# Patient Record
Sex: Male | Born: 1955
Health system: Southern US, Community
[De-identification: ages and names within clinical notes are randomized; demographics above are authoritative.]

## PROBLEM LIST (undated history)

## (undated) DIAGNOSIS — I10 Essential (primary) hypertension: Secondary | ICD-10-CM

## (undated) DIAGNOSIS — M1990 Unspecified osteoarthritis, unspecified site: Secondary | ICD-10-CM

## (undated) DIAGNOSIS — M549 Dorsalgia, unspecified: Secondary | ICD-10-CM

## (undated) DIAGNOSIS — F419 Anxiety disorder, unspecified: Secondary | ICD-10-CM

## (undated) DIAGNOSIS — E079 Disorder of thyroid, unspecified: Secondary | ICD-10-CM

## (undated) DIAGNOSIS — G8929 Other chronic pain: Secondary | ICD-10-CM

## (undated) DIAGNOSIS — E785 Hyperlipidemia, unspecified: Secondary | ICD-10-CM

## (undated) HISTORY — DX: Essential (primary) hypertension: I10

## (undated) HISTORY — PX: ANTERIOR FUSION CERVICAL SPINE: SUR626

## (undated) HISTORY — DX: Disorder of thyroid, unspecified: E07.9

## (undated) HISTORY — DX: Dorsalgia, unspecified: M54.9

## (undated) HISTORY — DX: Other chronic pain: G89.29

## (undated) HISTORY — DX: Unspecified osteoarthritis, unspecified site: M19.90

## (undated) HISTORY — DX: Hyperlipidemia, unspecified: E78.5

## (undated) HISTORY — DX: Anxiety disorder, unspecified: F41.9

---

## 2015-08-07 DIAGNOSIS — M549 Dorsalgia, unspecified: Secondary | ICD-10-CM

## 2015-08-07 DIAGNOSIS — G8929 Other chronic pain: Secondary | ICD-10-CM

## 2015-08-07 HISTORY — DX: Dorsalgia, unspecified: M54.9

## 2015-08-07 HISTORY — DX: Other chronic pain: G89.29

## 2017-07-10 ENCOUNTER — Emergency Department (HOSPITAL_BASED_OUTPATIENT_CLINIC_OR_DEPARTMENT_OTHER)
Admit: 2017-07-10 | Discharge: 2017-07-10 | Disposition: A | Discharge status: Home / Self Care | Payer: Medicare Other | Attending: Emergency Medicine | Admitting: Emergency Medicine

## 2017-07-10 ENCOUNTER — Encounter (HOSPITAL_COMMUNITY): Payer: Self-pay | Admitting: Emergency Medicine

## 2017-07-10 ENCOUNTER — Emergency Department (HOSPITAL_COMMUNITY)
Admission: EM | Admit: 2017-07-10 | Discharge: 2017-07-10 | Disposition: A | Discharge status: Home / Self Care | Payer: Medicare Other | Attending: Emergency Medicine | Admitting: Emergency Medicine

## 2017-07-10 ENCOUNTER — Emergency Department (HOSPITAL_COMMUNITY): Payer: Medicare Other

## 2017-07-10 DIAGNOSIS — X500XXA Overexertion from strenuous movement or load, initial encounter: Secondary | ICD-10-CM | POA: Diagnosis not present

## 2017-07-10 DIAGNOSIS — S59911A Unspecified injury of right forearm, initial encounter: Secondary | ICD-10-CM | POA: Insufficient documentation

## 2017-07-10 DIAGNOSIS — M7989 Other specified soft tissue disorders: Secondary | ICD-10-CM

## 2017-07-10 DIAGNOSIS — Y929 Unspecified place or not applicable: Secondary | ICD-10-CM | POA: Insufficient documentation

## 2017-07-10 DIAGNOSIS — Y998 Other external cause status: Secondary | ICD-10-CM | POA: Insufficient documentation

## 2017-07-10 DIAGNOSIS — Y9389 Activity, other specified: Secondary | ICD-10-CM | POA: Diagnosis not present

## 2017-07-10 DIAGNOSIS — M79601 Pain in right arm: Secondary | ICD-10-CM

## 2017-07-10 MED ORDER — NAPROXEN 250 MG PO TABS
500.0000 mg | ORAL_TABLET | Freq: Once | ORAL | Status: AC
  Administered 2017-07-10: 500 mg via ORAL
  Filled 2017-07-10: qty 2

## 2017-07-10 MED ORDER — HYDROCODONE-ACETAMINOPHEN 5-325 MG PO TABS
1.0000 | ORAL_TABLET | Freq: Once | ORAL | Status: AC
  Administered 2017-07-10: 1 via ORAL
  Filled 2017-07-10: qty 1

## 2017-07-10 MED ORDER — CYCLOBENZAPRINE HCL 10 MG PO TABS: 10.0000 mg | ORAL_TABLET | Freq: Three times a day (TID) | ORAL | 0 refills | Status: DC | PRN

## 2017-07-10 MED ORDER — NAPROXEN 250 MG PO TABS: 250.0000 mg | ORAL_TABLET | Freq: Two times a day (BID) | ORAL | 0 refills | Status: DC

## 2017-07-10 NOTE — ED Notes (Signed)
Patient transported to X-ray 

## 2017-07-10 NOTE — ED Provider Notes (Signed)
[facility]-EMERGENCY DEPT Provider Note   CSN: [id number] Arrival date & time: 07/10/17  [id number]  By signing my name below, I, Linna DarnerRussell Turner, attest that this documentation has been prepared under the direction and in the presence of Demetrios LollKenneth Leaphart, PA-C. Electronically Signed: Linna Darnerussell Turner, Scribe. 07/10/2017. 10:27 AM.  History   Chief Complaint Chief Complaint  Patient presents with  . Arm Pain   The history is provided by the patient. No language interpreter was used.    HPI Comments: Ricky Walter is a 61 y.o. male who presents to the Emergency Department for evaluation of a volar right forearm injury sustained yesterday evening. He was trying to open the hood of his car that was stuck on the latch. He pulled upward with his right arm and heard popping sensations from the right forearm followed by significant pain. Patient states his pain has gradually worsened and he has also developed some swelling to his right forearm. He reports that he is unable to flex his right arm secondary to pain. There are no alleviating factors noted. No prior h/o right forearm injuries. He denies numbness/tingling, bruising, open wounds, or any other associated symptoms.  History reviewed. No pertinent past medical history.  There are no active problems to display for this patient.   History reviewed. No pertinent surgical history.     Home Medications    Prior to Admission medications   Not on File    Family History History reviewed. No pertinent family history.  Social History Social History  Substance Use Topics  . Smoking status: Never Smoker  . Smokeless tobacco: Never Used  . Alcohol use No     Allergies   Patient has no known allergies.   Review of Systems Review of Systems  Musculoskeletal: Positive for joint swelling and myalgias.  Skin: Negative for color change and wound.  Neurological: Negative for weakness and numbness.   Physical Exam Updated Vital Signs BP (!) 131/99  (BP Location: Left Arm)   Pulse 68   Temp 98.7 F (37.1 C) (Oral)   Resp 17   Ht 5\' 11"  (1.803 m)   Wt 228 lb (103.4 kg)   SpO2 97%   BMI 31.80 kg/m   Physical Exam  Constitutional: He is oriented to person, place, and time. He appears well-developed and well-nourished. No distress.  HENT:  Head: Normocephalic and atraumatic.  Eyes: Conjunctivae and EOM are normal.  Neck: Neck supple. No tracheal deviation present.  Cardiovascular: Normal rate and intact distal pulses.   Pulmonary/Chest: Effort normal. No respiratory distress.  Musculoskeletal: Normal range of motion.  Patient with pain to the right forearm with flexion and extension. Has to passively flex his right elbow with his left hand. Edema noted of the right forearm. No obvious edema of the right elbow. Full range of motion of the right shoulder. Patient able to fully raise his arm over his shoulder. Patient able to fully flex and extend all MC, DIP, PIP joints of the right hand. Sensation intact. Cap refill normal. Radial pulses 2+ bilaterally. No obvious bulging. No erythema or warmth to the joints.  Neurological: He is alert and oriented to person, place, and time.  Normal grip strength bilaterally.  Skin: Skin is warm and dry. Capillary refill takes less than 2 seconds.  Psychiatric: He has a normal mood and affect. His behavior is normal.  Nursing note and vitals reviewed.  ED Treatments / Results  Labs (all labs ordered are listed, but only abnormal results are displayed)  Labs Reviewed - No data to display  EKG  EKG Interpretation None       Radiology Dg Elbow Complete Right  Result Date: 07/10/2017 CLINICAL DATA:  Injured right elbow/forearm yesterday evening. EXAM: RIGHT ELBOW - COMPLETE 3+ VIEW COMPARISON:  None. FINDINGS: The joint spaces are maintained. Minimal degenerative changes. Small olecranon spur. No fracture or osteochondral lesion. No joint effusion. IMPRESSION: Minimal/ mild degenerative changes  but no acute bony findings or joint effusion. Electronically Signed   By: Rudie Meyer M.D.   On: 07/10/2017 11:46    Procedures Procedures (including critical care time)  DIAGNOSTIC STUDIES: Oxygen Saturation is 97% on RA, normal by my interpretation.    COORDINATION OF CARE: 10:26 AM Discussed treatment plan with pt at bedside and pt agreed to plan.  Medications Ordered in ED Medications  HYDROcodone-acetaminophen (NORCO/VICODIN) 5-325 MG per tablet 1 tablet (1 tablet Oral Given 07/10/17 1053)  naproxen (NAPROSYN) tablet 500 mg (500 mg Oral Given 07/10/17 1406)     Initial Impression / Assessment and Plan / ED Course  I have reviewed the triage vital signs and the nursing notes.  Pertinent labs & imaging results that were available during my care of the patient were reviewed by me and considered in my medical decision making (see chart for details).     Patient presents to the ED with right forearm pain following mechanical injury yesterday. Edema noted to the right forearm. Patient is neurovascularly intact with normal strength. No signs of septic joints. Patient X-Ray negative for obvious fracture or dislocation. Ultrasound and no signs of DVT. Unsure patient etiology likely due to muscle strain or pull. Pain managed in ED. Pt advised to follow up with orthopedics if symptoms persist for possibility of missed fracture diagnosis. Patient given sling while in ED, conservative therapy recommended and discussed. Patient will be dc home & is agreeable with above plan.   Final Clinical Impressions(s) / ED Diagnoses   Final diagnoses:  Right arm pain    New Prescriptions Discharge Medication List as of 07/10/2017  2:13 PM    START taking these medications   Details  cyclobenzaprine (FLEXERIL) 10 MG tablet Take 1 tablet (10 mg total) by mouth 3 (three) times daily as needed for muscle spasms., Starting Thu 07/10/2017, Print    naproxen (NAPROSYN) 250 MG tablet Take 1 tablet (250 mg  total) by mouth 2 (two) times daily., Starting Thu 07/10/2017, Print       I personally performed the services described in this documentation, which was scribed in my presence. The recorded information has been reviewed and is accurate.    Rise Mu, PA-C 07/10/17 1429    Cathren Laine, MD 07/10/17 [phone]

## 2017-07-10 NOTE — ED Triage Notes (Signed)
Pt sts right arm pain around elbow after injuring while working on a car yesterday

## 2017-07-10 NOTE — Discharge Instructions (Signed)
X-ray showed no signs of fracture or dislocation. Her ultrasound shows no signs of blood clot. Unsure of the etiology of your pain. Likely from pulled muscle or sprain. Wear the sling for comfort. Increase range of motion as tolerated. Rest, ice, elevate the arm. Take the naproxen as prescribed. Avoid any extra ibuprofen, Aleve, Advil, Motrin. Please the the Flexeril for muscle relaxation. This medication will make you drowsy so avoid situation that could place you in danger.   Follow-up with the orthopedic doctor if symptoms are not improving. Return to the ED if your symptoms worsen.

## 2017-07-10 NOTE — Progress Notes (Signed)
**  Preliminary report by tech**  Right upper extremity venous duplex complete. There is no evidence of deep or superficial vein thrombosis involving the right upper extremity. All visualized vessels appear patent and compressible.  Results were given to Dr. Denton LankSteinl.   07/10/17 1:59 PM Ricky Walter RVT

## 2017-08-06 ENCOUNTER — Encounter: Payer: Self-pay | Admitting: Family Medicine

## 2017-08-06 ENCOUNTER — Ambulatory Visit (INDEPENDENT_AMBULATORY_CARE_PROVIDER_SITE_OTHER): Payer: Medicare Other | Admitting: Family Medicine

## 2017-08-06 VITALS — BP 132/88 | HR 70 | Temp 98.9°F | Ht 71.0 in | Wt 229.0 lb

## 2017-08-06 DIAGNOSIS — N529 Male erectile dysfunction, unspecified: Secondary | ICD-10-CM | POA: Diagnosis not present

## 2017-08-06 DIAGNOSIS — M19042 Primary osteoarthritis, left hand: Secondary | ICD-10-CM

## 2017-08-06 DIAGNOSIS — M19041 Primary osteoarthritis, right hand: Secondary | ICD-10-CM | POA: Diagnosis not present

## 2017-08-06 DIAGNOSIS — M545 Low back pain, unspecified: Secondary | ICD-10-CM | POA: Insufficient documentation

## 2017-08-06 DIAGNOSIS — F329 Major depressive disorder, single episode, unspecified: Secondary | ICD-10-CM

## 2017-08-06 DIAGNOSIS — Z0000 Encounter for general adult medical examination without abnormal findings: Secondary | ICD-10-CM | POA: Insufficient documentation

## 2017-08-06 DIAGNOSIS — E039 Hypothyroidism, unspecified: Secondary | ICD-10-CM | POA: Insufficient documentation

## 2017-08-06 DIAGNOSIS — F32A Depression, unspecified: Secondary | ICD-10-CM | POA: Insufficient documentation

## 2017-08-06 DIAGNOSIS — M549 Dorsalgia, unspecified: Secondary | ICD-10-CM

## 2017-08-06 DIAGNOSIS — M1990 Unspecified osteoarthritis, unspecified site: Secondary | ICD-10-CM

## 2017-08-06 DIAGNOSIS — G8929 Other chronic pain: Secondary | ICD-10-CM | POA: Insufficient documentation

## 2017-08-06 DIAGNOSIS — M5441 Lumbago with sciatica, right side: Secondary | ICD-10-CM

## 2017-08-06 MED ORDER — TADALAFIL 20 MG PO TABS: 10.0000 mg | ORAL_TABLET | ORAL | 1 refills | Status: DC | PRN

## 2017-08-06 MED ORDER — NAPROXEN 250 MG PO TABS: 250.0000 mg | ORAL_TABLET | Freq: Two times a day (BID) | ORAL | 0 refills | Status: AC

## 2017-08-06 MED ORDER — NAPROXEN 250 MG PO TABS: 250.0000 mg | ORAL_TABLET | Freq: Two times a day (BID) | ORAL | 0 refills | Status: DC

## 2017-08-06 NOTE — Progress Notes (Signed)
Subjective: Chief Complaint  Patient presents with  . New Patient (Initial Visit)     HPI: Ricky Walter is a 61 y.o. presenting to clinic today to discuss the following:  1 Chronic Back Pain 2 Depressed mood 3 Arthritis in the hands and feet 4 Erectile Dysfunction 5 Hypothyroidism  Patient is new to practice and states he has not had any health care for the past 2 years. Around that time he suddenly had left-sided weakness, decreased ability to walk and could no longer work. Since that time he has had chronic back pain. He states he has had multiple workups for a stroke but all came back negative. He cannot recall any inciting event that could have caused the issue. His back pain has been constant since this time but he has not taken any medication for it in over a year.   His inability to work has caused him to feel down and depressed with being very emotional at times and easily upset and made to cry. He sometimes cries without knowing the reason why. He denies any suicidal ideation or feeling hopeless, or feeling a loss of interest in activities that he likes to do.  He also endorses pain in his hands bilaterally and swelling for the past two years that gets worse with use and better with rest.  He endorses he no longer is able to get and maintain an erection despite wanting to engage in sexual intercourse with his wife.  He stats he has previously been diagnosed with Hypothyroidism but has not taken any medication in over a year due to not having any health insurance.  Health Maintenance: Hep C screen     ROS noted in HPI.   Past Medical, Surgical, Social, and Family History Reviewed & Updated per EMR.   Pertinent Historical Findings include:   History  Smoking Status  . Never Smoker  Smokeless Tobacco  . Never Used      Objective: BP 132/88   Pulse 70   Temp 98.9 F (37.2 C) (Oral)   Ht 5' 11"  (1.803 m)   Wt 229 lb (103.9 kg)   SpO2 98%   BMI 31.94 kg/m    Vitals and nursing notes reviewed  Physical Exam  Gen: Alert and Oriented x 3, NAD HEENT: Normocephalic, atraumatic, PERRLA, EOMI, TM visible with good light reflex, non-swollen, non-erythematous turbinates, normal pharyngeal mucosa Neck: trachea midline, no thyroidmegaly, no LAD, scar on right side of neck. Resp: CTAB, no wheezing, rales, or rhonchi, comfortable work of breathing CV: RRR, no murmurs, normal S1, S2 split, +2 pulses dorsalis pedis bilaterally Abd: non-distended, non-tender, soft, +bs in all four quadrants, no hepatosplenomegaly MSK: Limited flexion, extension, and abduction of LUE, otherwise FROM in all four extremities Ext: no clubbing, cyanosis, or edema Neuro: CN II-XII intact, no focal or gross deficits; 5/5 strength on RUE and RLE, 4/5 strength on LUE and LLE. Reflexes intact Psych: appropriate behavior, mood, denies any suicidal ideation   No results found for this or any previous visit (from the past 92 hour(s)).  Assessment/Plan:  Hypothyroidism Patient states he has been diagnosed with Hypothyroidism but has not taken any medication for it in over a year. States he was on Levothyroxine. Getting TSH to check current levels.  Will prescribe Levothyroxine if TSH is elevated.  Arthritis Patient has had chronic arthritis for the past two years primarily in his hands. He has not been taking any pain medication for it in over a year.  Patient states Tylenol upsets his stomach and he cannot take it.   Prescribed Naproxen 254m BID to see if it helps control his pain. Ordered RF, ESR, and CRP to rule out Rheumatoid Arthritis as a potential cause of patient symptoms.  Erectile dysfunction Patient has had symptoms for past two years.  Gave a 5 day supply of Cialis to see if it helps with symptoms. Patient will return in one month to follow up.  Ordered labs to rule out potential secondary causes.  Mood disorder of depressed type Patient scored 2 on PHQ-9 and  denies suicidal ideation. He is having frequent episodes of crying, intense emotional swings, and feeling down.  Although patient is not clinically depressed he is exhibiting signs of potential mood disorder and could benefit from behavioral health involvement. Losing his job 2 years ago has significantly changes is attitude according to patient and his wife.  Encounter for medical examination to establish care Ordered CBC, CMP+Liver, Lipids, Hep C screen to get baseline labs and check for underlying health issues.  Patient will get BP cuff and record 2 weeks worth of readings to make sure he is not hypertensive. Prehypertensive today in office.  Chronic back pain greater than 3 months duration Started patient on Naproxen 2528mBID for chronic back pain. Patient is not a good candidate for opiods due to chronic issues and would likely benefit from physical therapy.   Advised patient to attend physical therapy at next visit to help with symptoms.    Please see problem based Assessment and Plan PATIENT EDUCATION PROVIDED: See AVS    Diagnosis and plan along with any newly prescribed medication(s) were discussed in detail with this patient today. The patient verbalized understanding and agreed with the plan. Patient advised if symptoms worsen return to clinic or ER.   Health Maintainance:   Orders Placed This Encounter  Procedures  . CBC with Differential  . CMP and Liver  . TSH  . Lipid Panel  . Rheumatoid factor  . C-reactive protein  . Sedimentation Rate  . Hepatitis C Antibody    Meds ordered this encounter  Medications  . DISCONTD: naproxen (NAPROSYN) 250 MG tablet    Sig: Take 1 tablet (250 mg total) by mouth 2 (two) times daily.    Dispense:  20 tablet    Refill:  0  . naproxen (NAPROSYN) 250 MG tablet    Sig: Take 1 tablet (250 mg total) by mouth 2 (two) times daily.    Dispense:  60 tablet    Refill:  0  . tadalafil (CIALIS) 20 MG tablet    Sig: Take 0.5 tablets  (10 mg total) by mouth every other day as needed for erectile dysfunction.    Dispense:  5 tablet    Refill:  1 ToyahDO 08/06/2017, 2:46 PM PGY-1, [place]

## 2017-08-06 NOTE — Assessment & Plan Note (Signed)
Ordered CBC, CMP+Liver, Lipids, Hep C screen to get baseline labs and check for underlying health issues.  Patient will get BP cuff and record 2 weeks worth of readings to make sure he is not hypertensive. Prehypertensive today in office.

## 2017-08-06 NOTE — Patient Instructions (Addendum)
It was great to meet you today! Thank you for letting me participate in your care!  Today, we discussed your chronic back pain. We will start you on Naproxen 250mg  twice a day for better pain control.  We also discussed your joint pain and swelling that is likely due to osteoarthritis. The Naproxen will help control this pain as well but it will not make the pain go away. Rest and ice packs will also help with the pain in your joints.  We also discussed your current episodes of crying, feeling down, and feeling tired. Please call Dr. Pascal LuxKane and schedule an appointment to meet with her to talk about these issues as I feel like she can provide good counseling.  Finally, we also addressed your erectile dysfunction. I will start you on Cialis today.  Be well, Ricky Schickim Dashaun Onstott, DO PGY-1, [facility]

## 2017-08-06 NOTE — Assessment & Plan Note (Signed)
Patient states he has been diagnosed with Hypothyroidism but has not taken any medication for it in over a year. States he was on Levothyroxine. Getting TSH to check current levels.  Will prescribe Levothyroxine if TSH is elevated.

## 2017-08-06 NOTE — Assessment & Plan Note (Signed)
Patient scored 2 on PHQ-9 and denies suicidal ideation. He is having frequent episodes of crying, intense emotional swings, and feeling down.  Although patient is not clinically depressed he is exhibiting signs of potential mood disorder and could benefit from behavioral health involvement. Losing his job 2 years ago has significantly changes is attitude according to patient and his wife.

## 2017-08-06 NOTE — Assessment & Plan Note (Signed)
Patient has had symptoms for past two years.  Gave a 5 day supply of Cialis to see if it helps with symptoms. Patient will return in one month to follow up.  Ordered labs to rule out potential secondary causes.

## 2017-08-06 NOTE — Assessment & Plan Note (Signed)
Patient has had chronic arthritis for the past two years primarily in his hands. He has not been taking any pain medication for it in over a year.   Patient states Tylenol upsets his stomach and he cannot take it.   Prescribed Naproxen 280m BID to see if it helps control his pain. Ordered RF, ESR, and CRP to rule out Rheumatoid Arthritis as a potential cause of patient symptoms.

## 2017-08-06 NOTE — Assessment & Plan Note (Signed)
Started patient on Naproxen 250mg  BID for chronic back pain. Patient is not a good candidate for opiods due to chronic issues and would likely benefit from physical therapy.   Advised patient to attend physical therapy at next visit to help with symptoms.

## 2017-08-07 ENCOUNTER — Other Ambulatory Visit: Payer: Self-pay | Admitting: Family Medicine

## 2017-08-07 DIAGNOSIS — E039 Hypothyroidism, unspecified: Secondary | ICD-10-CM

## 2017-08-07 DIAGNOSIS — R768 Other specified abnormal immunological findings in serum: Secondary | ICD-10-CM

## 2017-08-07 LAB — CBC WITH DIFFERENTIAL/PLATELET
BASOS ABS: 0 10*3/uL (ref 0.0–0.2)
BASOS: 0 %
EOS (ABSOLUTE): 0.1 10*3/uL (ref 0.0–0.4)
Eos: 2 %
Hematocrit: 43.4 % (ref 37.5–51.0)
Hemoglobin: 14.1 g/dL (ref 13.0–17.7)
IMMATURE GRANS (ABS): 0 10*3/uL (ref 0.0–0.1)
IMMATURE GRANULOCYTES: 0 %
LYMPHS: 26 %
Lymphocytes Absolute: 1.5 10*3/uL (ref 0.7–3.1)
MCH: 29.4 pg (ref 26.6–33.0)
MCHC: 32.5 g/dL (ref 31.5–35.7)
MCV: 90 fL (ref 79–97)
MONOS ABS: 0.4 10*3/uL (ref 0.1–0.9)
Monocytes: 7 %
NEUTROS PCT: 65 %
Neutrophils Absolute: 3.7 10*3/uL (ref 1.4–7.0)
PLATELETS: 214 10*3/uL (ref 150–379)
RBC: 4.8 x10E6/uL (ref 4.14–5.80)
RDW: 14.4 % (ref 12.3–15.4)
WBC: 5.8 10*3/uL (ref 3.4–10.8)

## 2017-08-07 LAB — CMP AND LIVER
ALBUMIN: 4.4 g/dL (ref 3.6–4.8)
ALK PHOS: 95 IU/L (ref 39–117)
ALT: 31 IU/L (ref 0–44)
AST: 40 IU/L (ref 0–40)
BUN: 11 mg/dL (ref 8–27)
Bilirubin Total: 0.4 mg/dL (ref 0.0–1.2)
Bilirubin, Direct: 0.12 mg/dL (ref 0.00–0.40)
CO2: 25 mmol/L (ref 20–29)
CREATININE: 1.13 mg/dL (ref 0.76–1.27)
Calcium: 9.6 mg/dL (ref 8.6–10.2)
Chloride: 102 mmol/L (ref 96–106)
GFR calc Af Amer: 81 mL/min/{1.73_m2} (ref >59)
GFR calc non Af Amer: 70 mL/min/{1.73_m2} (ref >59)
GLUCOSE: 96 mg/dL (ref 65–99)
POTASSIUM: 4.7 mmol/L (ref 3.5–5.2)
Sodium: 143 mmol/L (ref 134–144)
TOTAL PROTEIN: 7.7 g/dL (ref 6.0–8.5)

## 2017-08-07 LAB — TSH: TSH: 11.33 u[IU]/mL — AB (ref 0.450–4.500)

## 2017-08-07 LAB — LIPID PANEL
CHOLESTEROL TOTAL: 168 mg/dL (ref 100–199)
Chol/HDL Ratio: 3.1 ratio (ref 0.0–5.0)
HDL: 54 mg/dL (ref >39)
LDL Calculated: 95 mg/dL (ref 0–99)
Triglycerides: 96 mg/dL (ref 0–149)
VLDL CHOLESTEROL CAL: 19 mg/dL (ref 5–40)

## 2017-08-07 LAB — C-REACTIVE PROTEIN: CRP: 0.5 mg/L (ref 0.0–4.9)

## 2017-08-07 LAB — HEPATITIS C ANTIBODY

## 2017-08-07 LAB — RHEUMATOID FACTOR

## 2017-08-07 LAB — SEDIMENTATION RATE: SED RATE: 18 mm/h (ref 0–30)

## 2017-08-07 MED ORDER — LEVOTHYROXINE SODIUM 150 MCG PO TABS: 150.0000 ug | ORAL_TABLET | Freq: Every day | ORAL | 3 refills | Status: DC

## 2017-08-07 NOTE — Progress Notes (Signed)
Patient was called and told he had elevated TSH and that I sent in Levothyroxine starting at dose to take once per day before breakfast with water.  He was also told he tested positive for Hepatitis C Antibody and would need another test. He can come in and just get blood work.  Informed patient I will see him pending results of blood work and at least in 6 weeks time for thyroid check.

## 2017-08-07 NOTE — Assessment & Plan Note (Signed)
TSH was elevated at 11.330  Patient will be restarted on Levothyroxine starting at 1.846mcg/kg/d which comes out to 166mcg per day. I will start him on 150mcg daily and then see him again in 6 weeks to get another TSH.

## 2017-08-11 ENCOUNTER — Other Ambulatory Visit: Payer: Self-pay | Admitting: Family Medicine

## 2017-08-11 ENCOUNTER — Telehealth: Payer: Self-pay | Admitting: *Deleted

## 2017-08-11 MED ORDER — SILDENAFIL CITRATE 100 MG PO TABS: 50.0000 mg | ORAL_TABLET | Freq: Every day | ORAL | 1 refills | Status: DC | PRN

## 2017-08-11 NOTE — Telephone Encounter (Signed)
Pharmacist from [facility] requesting to change tadalafil 20 mg to sildenafil l 20 mg. Sildenafil is cheaper for the patient. Please advise. Clovis Pu, RN

## 2017-08-11 NOTE — Progress Notes (Signed)
Pharmacy called for patient requesting change from Cialis to Viagra due to cost. Made the change.

## 2017-08-14 ENCOUNTER — Telehealth: Payer: Self-pay | Admitting: Family Medicine

## 2017-08-14 NOTE — Telephone Encounter (Signed)
His pharmacy does not carry cialis so he would like viagra or generic viagra.  His pharmacy is [facility].

## 2017-08-15 NOTE — Telephone Encounter (Signed)
Patient informed. 

## 2017-08-20 ENCOUNTER — Telehealth: Payer: Self-pay | Admitting: Psychology

## 2017-08-20 NOTE — Telephone Encounter (Signed)
Patient called to request a [facility] appointment.  He had a PHQ-9 of 2 at his last visit with Dr. Karen Chafe but has been feeling down secondary to a job loss.  He wished to come on Thursday at 10:00.  Scheduled him with Sammuel Hines.

## 2017-08-21 ENCOUNTER — Telehealth: Payer: Self-pay | Admitting: *Deleted

## 2017-08-21 NOTE — Telephone Encounter (Signed)
Patient called requesting lab results. Patient remembered speaking with provider regarding Hep C and TSH levels. Advised patient that all other labs were normal. Advised patient that he should start the Hep A and B vaccines due to positive Hep C results. Explained to patient what the vaccines protect against.  Appointment scheduled with nurse on 08/28/17 for first Hept B and Flu shot. Hep A vaccine will be given at Hep B #2 schedule dose.    Patient is requesting that PCP give him a call to discuss when his next follow up would be for Hep C diagnosis.  Please advise. Number on file is correct.  Clovis PuMartin, Tamika L, RN

## 2017-08-26 ENCOUNTER — Ambulatory Visit: Payer: Medicare Other

## 2017-08-28 ENCOUNTER — Ambulatory Visit (INDEPENDENT_AMBULATORY_CARE_PROVIDER_SITE_OTHER): Payer: Medicare Other | Admitting: Licensed Clinical Social Worker

## 2017-08-28 ENCOUNTER — Ambulatory Visit (INDEPENDENT_AMBULATORY_CARE_PROVIDER_SITE_OTHER): Payer: Medicare HMO | Admitting: *Deleted

## 2017-08-28 DIAGNOSIS — F439 Reaction to severe stress, unspecified: Secondary | ICD-10-CM

## 2017-08-28 DIAGNOSIS — Z23 Encounter for immunization: Secondary | ICD-10-CM

## 2017-08-28 NOTE — Progress Notes (Signed)
  Total time:30 minutes Type of Service: Integrated Behavioral Health office visit Interpretor:No.  SUBJECTIVE: Ricky Walter is a 61 y.o. male  referred by Dr. Pascal LuxKane for feeling down secondary to job loss.  Patient reports the following symptoms and or concerns: loss of interest in favorite activities, feeling down and feeling like something is missing from his life.  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  GOALS ADDRESSED:  Patient will reduce symptoms of: depression and stress; increase ability [id number]:coping skills, self-management skills and stress reduction, he will also :Increase healthy adjustment to current life circumstances. ISSUES DISCUSSED: family stressors,relationship stressors and psychosocial stressors  INTERVENTIONS:Brief Counseling/Psychotherapy , Reflective listening. Standardized Assessments completed: PHQ 9=6,indicates symptoms of: mild depression.  ASSESSMENT: Patient currently experiencing symptoms of mild depression.  Symptoms exacerbated by family stressors and life transitions.  Patient may benefit from, and is in agreement to receive further assessment and brief therapeutic interventions to assist with managing his symptoms.   PLAN: 1. Patient will F/U with LCSW in one week 2. Behavioral recommendations: none at this time  3. Referral:none at this time  Ricky Hineseborah Paxten Appelt, LCSW Licensed Clinical Social  [facility]   [phone] 11:41 AM

## 2017-08-28 NOTE — Progress Notes (Signed)
Patient present for flu and Hep B vaccines.  Patient is also wondering when he needs to get his follow up Hep C labs.  Will forward to MD to advise on this.  Patient is returning next week for PPD placement and to see integrated care.  He made a follow up appointment with PCP on 09-18-17. Arraya Buck,CMA

## 2017-09-03 ENCOUNTER — Ambulatory Visit: Payer: Medicare Other

## 2017-09-12 ENCOUNTER — Telehealth: Payer: Self-pay | Admitting: Family Medicine

## 2017-09-18 ENCOUNTER — Ambulatory Visit: Payer: Medicare Other | Admitting: Family Medicine

## 2017-09-25 ENCOUNTER — Ambulatory Visit (INDEPENDENT_AMBULATORY_CARE_PROVIDER_SITE_OTHER): Payer: Medicare HMO | Admitting: Family Medicine

## 2017-09-25 ENCOUNTER — Encounter: Payer: Self-pay | Admitting: Family Medicine

## 2017-09-25 VITALS — BP 122/80 | HR 71 | Temp 98.2°F | Ht 71.0 in | Wt 222.0 lb

## 2017-09-25 DIAGNOSIS — N529 Male erectile dysfunction, unspecified: Secondary | ICD-10-CM

## 2017-09-25 DIAGNOSIS — R768 Other specified abnormal immunological findings in serum: Secondary | ICD-10-CM | POA: Diagnosis not present

## 2017-09-25 DIAGNOSIS — M5441 Lumbago with sciatica, right side: Secondary | ICD-10-CM

## 2017-09-25 DIAGNOSIS — B182 Chronic viral hepatitis C: Secondary | ICD-10-CM | POA: Insufficient documentation

## 2017-09-25 DIAGNOSIS — E039 Hypothyroidism, unspecified: Secondary | ICD-10-CM | POA: Diagnosis not present

## 2017-09-25 DIAGNOSIS — G8929 Other chronic pain: Secondary | ICD-10-CM

## 2017-09-25 MED ORDER — SILDENAFIL CITRATE 20 MG PO TABS: ORAL_TABLET | ORAL | 0 refills | Status: DC

## 2017-09-25 MED ORDER — DULOXETINE HCL 30 MG PO CPEP: 60.0000 mg | ORAL_CAPSULE | Freq: Every day | ORAL | 2 refills | Status: DC

## 2017-09-25 NOTE — Progress Notes (Signed)
Subjective:   Patient ID: Ricky Walter    DOB: [birth year], 61 y.o. male   MRN: [id number]  CC: "Back pain"  HPI: Ricky Walter is a 61 y.o. male who presents to clinic today for back pain. Problems discussed today are as follows:  Chronic lower back pain: Patient states he was without health care for approximately 2 years until he developed sudden left-sided weakness and decreased ability to walk. He had extensive stroke workup, all of which were negative. Patient was subsequently found to have nerve impingement due to cervical spinal stenosis requiring surgery back on 10/2016. Patient states he did not comply with physical therapy during this time and has followed up as of 12/2016. He continues to have chronic right-sided low back pain since the surgery. He states he tried the naproxen with some improvement. He denies any recent trauma. ROS: Denies fevers or chills, nausea or vomiting, dysuria, melena or hematochezia, saddle anesthesia, loss of motor function or sensory loss, bowel or bladder incontinence.  Hypothyroidism: Patient was told he had low thyroid after his TSH was tested during his last visit. He was started on 150 g of Synthroid. He states he has tolerated this medication well. Prior to the test, he states he was off his medications for over one year. ROS: Denies change in weight, heat or cold intolerance, diarrhea or constipation, palpitations, diaphoresis, shortness of breath, chest pain.  Positive hepatitis C antibody: Recently screened for HCV found to have positive antibody. He denies history of IV drug use or exposure to HCV.  Erectile dysfunction: Ongoing issue for approximately 2 years. He states he cannot achieve or maintain an erection. He has been unable to fill his Viagra or Cialis prescription due to cost.  Complete ROS performed, see HPI for pertinent.  PMFSH: Hypothyroidism, arthritis, ED. Surgical history anterior fusion of cervical spine. Family history HLD, HTN,  thyroid disease, stroke. Smoking status reviewed. Medications reviewed.  Objective:   BP 122/80   Pulse 71   Temp 98.2 F (36.8 C) (Oral)   Ht  (1.803 m)   Wt 222 lb (100.7 kg)   SpO2 97%   BMI 30.96 kg/m  Vitals and nursing note reviewed.  General: elderly male, well nourished, well developed, in no acute distress with non-toxic appearance HEENT: normocephalic, atraumatic, moist mucous membranes Neck: supple, non-tender without lymphadenopathy, anterior neck scar on right side from prior surgery CV: regular rate and rhythm without murmurs, rubs, or gallops, no lower extremity edema Lungs: clear to auscultation bilaterally with normal work of breathing Abdomen: soft, non-tender, non-distended, normoactive bowel sounds Skin: warm, dry, no rashes or lesions, cap refill < 2 seconds Extremities: warm and well perfused, normal tone, ambulating slowly with cane primarily due to right back tenderness at paraspinal muscle on lumbar region, sensation intact throughout  Assessment & Plan:   Chronic right-sided low back pain with right-sided sciatica Chronic. Problem since cervical spine surgery last year. Likely multifactorial including deconditioned state, OA and poor ergonomics due to surgery, failure to f/u with PT. --Given trial of Cymbalta with instructions to take 30 mg daily for 1 week, then increase to 60 mg daily --RTC 4 weeks for recheck  Hypothyroidism Chronic. Has been off meds for >1 year. Restarted on synthroid 1 month ago. Will need to check TSH today to determine titration. --Will obtain TSH --Continue Synthroid 150 mcg daily  Erectile dysfunction Chronic. No nocturnal tumescence. Has not tried ED meds in past due to cost. Not taking nitrates. --  Will give trail of sildenafil 100 mg PRN  HCV antibody positive New finding on screening. No history of high risk exposure. --Will check quant level today and refer to infectious disease if positive for  treatment  Orders Placed This Encounter  Procedures  . TSH   Meds ordered this encounter  Medications  . sildenafil (REVATIO) 20 MG tablet    Sig: Take 5 tablets (100 mg) at once 1 hour prior to sexual activity as needed. Do not exceed 100 mg daily.    Dispense:  30 tablet    Refill:  0  . DULoxetine (CYMBALTA) 30 MG capsule    Sig: Take 2 capsules (60 mg total) by mouth daily.    Dispense:  60 capsule    Refill:  2    Durward Parcel, DO [facility], PGY-2 09/26/2017 10:36 AM

## 2017-09-25 NOTE — Assessment & Plan Note (Addendum)
Chronic. Problem since cervical spine surgery last year. Likely multifactorial including deconditioned state, OA and poor ergonomics due to surgery, failure to f/u with PT. --Given trial of Cymbalta with instructions to take 30 mg daily for 1 week, then increase to 60 mg daily --RTC 4 weeks for recheck

## 2017-09-25 NOTE — Assessment & Plan Note (Addendum)
Chronic. No nocturnal tumescence. Has not tried ED meds in past due to cost. Not taking nitrates. --Will give trail of sildenafil 100 mg PRN

## 2017-09-25 NOTE — Patient Instructions (Addendum)
Thank you for coming in to see [place] today. Please see below to review our plan for today's visit.  1. We will check your thyroid level today. Based on the results, I will contact you regarding what to do with your Synthroid. 2. We will check your hepatitis C titer level to figure out if you truly have hepatitis C. This is a treatable virus. 3. Take the sildenafil 20 mg tablets as instructed. Will take a total of 5 tablets at a time one hour prior to sexual intercourse. He will have to pay out of pocket for this medication. This is our most affordable erectile dysfunction medication. 4. I have started you on a new medication for your back pain called Cymbalta. Please take 1 capsule daily for the first week, then increase to 2 capsules daily.  Please call the clinic at [phone] if your symptoms worsen or you have any concerns. It was my pleasure to see you. -- Durward Parcel, DO [facility], PGY-2

## 2017-09-25 NOTE — Assessment & Plan Note (Addendum)
New finding on screening. No history of high risk exposure. --Will check quant level today and refer to infectious disease if positive for treatment

## 2017-09-25 NOTE — Assessment & Plan Note (Addendum)
Chronic. Has been off meds for >1 year. Restarted on synthroid 1 month ago. Will need to check TSH today to determine titration. --Will obtain TSH --Continue Synthroid 150 mcg daily

## 2017-09-26 ENCOUNTER — Telehealth: Payer: Self-pay | Admitting: Family Medicine

## 2017-09-26 LAB — TSH: TSH: 3.88 u[IU]/mL (ref 0.450–4.500)

## 2017-09-26 NOTE — Telephone Encounter (Signed)
Contacted patient regarding normal TSH results from 09/25/2017. Recently restarted on 150 g of Synthroid. Recommended patient continue this dose and follow-up at next scheduled appointment. -- Durward Parcel, DO [facility], PGY-2

## 2017-09-29 LAB — HCV REAL-TIME, PCR, QUANT
Hepatitis C Quantitation: 9960000 IU/mL
LOG 10: 6.998 {Log_IU}/mL

## 2017-09-29 LAB — HCV RNA QUANT
HCV log10: 6.998 log10 IU/mL
HEPATITIS C QUANTITATION: 9960000 [IU]/mL

## 2017-09-29 LAB — HCV RNA NAA QUAL RFX TO QUANT: HCV RNA NAA QUALITATIVE: POSITIVE — AB

## 2017-10-02 ENCOUNTER — Encounter: Payer: Self-pay | Admitting: Family Medicine

## 2017-10-02 NOTE — Progress Notes (Signed)
Printed letter and will mail to patient informing him of results. I will also personally call this patient to discuss with him in detail these results and stress the importance of having a follow up appointment with a Hepatologist with Infectious Disease clinic.  I am concerned about compliance in following up with appointments with this patient so I will be watching and following up with him closely.

## 2017-10-06 ENCOUNTER — Telehealth: Payer: Self-pay | Admitting: Family Medicine

## 2017-10-06 DIAGNOSIS — B182 Chronic viral hepatitis C: Secondary | ICD-10-CM

## 2017-10-06 NOTE — Telephone Encounter (Signed)
Talked to patient on the phone and explained the test results that likely indicated he had a chronic Hepatitis C infection. I informed patient he would need to be seen by a Hepatologist that would do more tests to determine which specific therapy is indicated for his particular viral type of Hep C infection. Patient expressed agreement and understanding.  I put in referral to Hepatology for patient and informed him someone would be contacting him soon with an appointment.

## 2017-10-10 ENCOUNTER — Telehealth: Payer: Self-pay | Admitting: Family Medicine

## 2017-10-10 NOTE — Telephone Encounter (Signed)
Patient says he was supposed to be called about a referral for his Hep C.  Please call, thanks

## 2017-10-10 NOTE — Telephone Encounter (Signed)
Referral just processed on 10/16. Please give 2 weeks. Patient will be contacted directly by [facility].

## 2018-01-28 ENCOUNTER — Ambulatory Visit: Payer: Medicare HMO | Admitting: Family Medicine

## 2018-02-18 ENCOUNTER — Ambulatory Visit: Payer: Medicare HMO | Admitting: Family Medicine

## 2018-03-09 ENCOUNTER — Encounter: Payer: Self-pay | Admitting: Family Medicine

## 2018-03-09 ENCOUNTER — Ambulatory Visit (INDEPENDENT_AMBULATORY_CARE_PROVIDER_SITE_OTHER): Payer: Medicare HMO | Admitting: Family Medicine

## 2018-03-09 ENCOUNTER — Other Ambulatory Visit: Payer: Self-pay

## 2018-03-09 VITALS — BP 118/78 | HR 69 | Temp 98.7°F | Ht 71.0 in | Wt 223.0 lb

## 2018-03-09 DIAGNOSIS — G8929 Other chronic pain: Secondary | ICD-10-CM | POA: Diagnosis not present

## 2018-03-09 DIAGNOSIS — R768 Other specified abnormal immunological findings in serum: Secondary | ICD-10-CM

## 2018-03-09 DIAGNOSIS — F329 Major depressive disorder, single episode, unspecified: Secondary | ICD-10-CM | POA: Diagnosis not present

## 2018-03-09 DIAGNOSIS — E038 Other specified hypothyroidism: Secondary | ICD-10-CM | POA: Diagnosis not present

## 2018-03-09 DIAGNOSIS — F32A Depression, unspecified: Secondary | ICD-10-CM

## 2018-03-09 DIAGNOSIS — M549 Dorsalgia, unspecified: Secondary | ICD-10-CM

## 2018-03-09 MED ORDER — AMITRIPTYLINE HCL 10 MG PO TABS: 10.0000 mg | ORAL_TABLET | Freq: Every day | ORAL | 2 refills | Status: DC

## 2018-03-09 MED ORDER — GABAPENTIN 100 MG PO CAPS: 100.0000 mg | ORAL_CAPSULE | Freq: Three times a day (TID) | ORAL | 3 refills | Status: DC

## 2018-03-09 NOTE — Patient Instructions (Addendum)
It was great to see you again today! Thank you for letting me participate in your care!  Today, we discussed your chronic pain issues. You agreed to try swimming at least 30 minutes a day for 3 times per week. You also endorsed having what is best described as nerve pain. I have written you a prescription for gabapentin 100mg  to take 3 times per day. I have also written you a prescription for amitriptyline 10mg , please take this once per day a night. This may help you with your pain and your feeling fatigued.  Please make sure you get seen by the hepatologist. It is very important to get treated for your Hep C infection that is chronic. I will contact them and have them call you to scheduled an appointment.   Be well, Jules Schickim Shalice Woodring, DO PGY-1, [facility]

## 2018-03-09 NOTE — Progress Notes (Signed)
Subjective: No chief complaint on file.  HPI: Ricky Walter is a 62 y.o. presenting to clinic today to discuss the following:  Follow up for management for his positive test for Hepatitis C. As of today Ricky Walter has/has not followed up with the hepatologist. He also needs to have his TSH checked today as it has not been rechecked in more than 5 months as he has missed several follow up appointments.   He comes in today to get another referral to Infectious Disease so he can be treated for Hep C.   His main complaint today is continued pain. He states this pain is not changing in quantity or quality since his last visit. It is severely limiting him in activity and he states he needs a cane to walk around most of the time (80%). He states he has mainly lower back pain but also pain in his shoulders, elbows, wrists, knees, and ankles. Previous workup was not significant for any autoimmune or inflammatory process present. He also endorses left arm numbness intermittently.  I will also recheck his TSH today and modify his Levothyroxine if needed.  He denies headache, SOB, chest pain, abdominal pain, nausea, vomiting,   Health Maintenance: HIV screen and Tetanus needed, not done today but will return and do next visit.    ROS noted in HPI.   Past Medical, Surgical, Social, and Family History Reviewed & Updated per EMR.   Pertinent Historical Findings include:   Social History   Tobacco Use  Smoking Status Never Smoker  Smokeless Tobacco Never Used   Objective: BP 118/78   Pulse 69   Temp 98.7 F (37.1 C) (Oral)   Ht 5\' 11"  (1.803 m)   Wt 223 lb (101.2 kg)   SpO2 98%   BMI 31.10 kg/m  Vitals and nursing notes reviewed  Physical Exam  Constitutional: He is oriented to person, place, and time and well-developed, well-nourished, and in no distress. No distress.  HENT:  Head: Normocephalic and atraumatic.  Eyes: Pupils are equal, round, and reactive to light. EOM are normal.    Neck: Normal range of motion. Neck supple.  Cardiovascular: Normal rate, regular rhythm, normal heart sounds and intact distal pulses.  No murmur heard. Pulmonary/Chest: Effort normal and breath sounds normal. No respiratory distress. He has no wheezes. He has no rales.  Abdominal: Soft. Bowel sounds are normal. He exhibits no distension. There is no tenderness. There is no rebound.  Musculoskeletal: Normal range of motion. He exhibits no deformity.  >11 of 14 tender points positive on exam Does have mild swelling of his hands bilaterally but no joint deviation or mcp, dip, or pcp swelling, no synovitis  Lymphadenopathy:    He has no cervical adenopathy.  Neurological: He is alert and oriented to person, place, and time. He has normal reflexes. No cranial nerve deficit.  Strength 5/5 in UE and LE bilaterally  Skin: Skin is warm and dry. No rash noted. No erythema.   No results found for this or any previous visit (from the past 72 hour(s)).  Assessment/Plan:  Hypothyroidism Patient will return to get TSH recheck. He had to leave to pick up a relative and so could not stay for blood work.  Stressed importance of getting his TSH rechecked since it has been so long since it was last checked.  HCV antibody positive Made another referral to Infectious Disease as he missed his last appointment.  Mood disorder of depressed type Patient has  history of depression but does not currently endorse depressed mood or any SI or harmful intention.  I am starting amitriptyline mainly for his tender points on exam as I have a suspicion of fibromyalgia. However, as a secondary effect this may help his depression and prevent him from having recurrent episodes.  Chronic back pain greater than 3 months duration Patient is still having chronic pain. Unclear if he is attempting any physical therapy but I have recommended at least 30 min of exercise 3 times a week with gradual increase to 5 times per week.    I have started him on amitriptyline and gabapentin for his chronic pain. I suspect he may have fibromyalgia as he has tested positive on over 11 of 14 tender points. However, this is a diagnosis of exclusion and I do not want to rule out other possible causes to explain his symptoms at this time such as multiple joint OA, Sciatic, or depression (contributing to his continued back pain).  He is not a good candidate for opioids as he is going to a methadone clinic. Please do not start this patient on opioids.   PATIENT EDUCATION PROVIDED: See AVS    Diagnosis and plan along with any newly prescribed medication(s) were discussed in detail with this patient today. The patient verbalized understanding and agreed with the plan. Patient advised if symptoms worsen return to clinic or ER.   Health Maintainance:   Orders Placed This Encounter  Procedures  . TSH    Standing Status:   Future    Standing Expiration Date:   03/10/2019  . Ambulatory referral to Infectious Disease    Referral Priority:   Routine    Referral Type:   Consultation    Referral Reason:   Specialty Services Required    Requested Specialty:   Infectious Diseases    Number of Visits Requested:   1    Meds ordered this encounter  Medications  . gabapentin (NEURONTIN) 100 MG capsule    Sig: Take 1 capsule (100 mg total) by mouth 3 (three) times daily.    Dispense:  90 capsule    Refill:  3  . amitriptyline (ELAVIL) 10 MG tablet    Sig: Take 1 tablet (10 mg total) by mouth at bedtime.    Dispense:  30 tablet    Refill:  2    Jules Schickim Zailee Vallely, DO 03/14/2018, 4:01 PM PGY-1, [facility]

## 2018-03-10 ENCOUNTER — Other Ambulatory Visit: Payer: Medicare HMO

## 2018-03-14 NOTE — Assessment & Plan Note (Signed)
Made another referral to Infectious Disease as he missed his last appointment.

## 2018-03-14 NOTE — Assessment & Plan Note (Signed)
Patient has history of depression but does not currently endorse depressed mood or any SI or harmful intention.  I am starting amitriptyline mainly for his tender points on exam as I have a suspicion of fibromyalgia. However, as a secondary effect this may help his depression and prevent him from having recurrent episodes.

## 2018-03-14 NOTE — Assessment & Plan Note (Addendum)
Patient will return to get TSH recheck. He had to leave to pick up a relative and so could not stay for blood work.  Stressed importance of getting his TSH rechecked since it has been so long since it was last checked.

## 2018-03-14 NOTE — Assessment & Plan Note (Signed)
Patient is still having chronic pain. Unclear if he is attempting any physical therapy but I have recommended at least 30 min of exercise 3 times a week with gradual increase to 5 times per week.   I have started him on amitriptyline and gabapentin for his chronic pain. I suspect he may have fibromyalgia as he has tested positive on over 11 of 14 tender points. However, this is a diagnosis of exclusion and I do not want to rule out other possible causes to explain his symptoms at this time such as multiple joint OA, Sciatic, or depression (contributing to his continued back pain).  He is not a good candidate for opioids as he is going to a methadone clinic. Please do not start this patient on opioids.

## 2018-03-25 ENCOUNTER — Telehealth: Payer: Self-pay | Admitting: Family Medicine

## 2018-03-25 NOTE — Telephone Encounter (Signed)
Has not received a call from the liver place yet for his referral.  Please call patient at [phone] and let him know what is going on with that appt.

## 2018-03-26 ENCOUNTER — Other Ambulatory Visit: Payer: Medicare HMO

## 2018-03-30 ENCOUNTER — Encounter: Payer: Medicare HMO | Admitting: Internal Medicine

## 2018-04-08 DIAGNOSIS — B182 Chronic viral hepatitis C: Secondary | ICD-10-CM | POA: Diagnosis not present

## 2018-04-09 ENCOUNTER — Other Ambulatory Visit: Payer: Self-pay | Admitting: Nurse Practitioner

## 2018-04-09 DIAGNOSIS — B182 Chronic viral hepatitis C: Secondary | ICD-10-CM

## 2018-04-15 DIAGNOSIS — B182 Chronic viral hepatitis C: Secondary | ICD-10-CM | POA: Diagnosis not present

## 2018-05-06 ENCOUNTER — Ambulatory Visit
Admission: RE | Admit: 2018-05-06 | Discharge: 2018-05-06 | Disposition: A | Discharge status: Home / Self Care | Payer: Medicare HMO | Source: Ambulatory Visit | Attending: Nurse Practitioner | Admitting: Nurse Practitioner

## 2018-05-06 DIAGNOSIS — B182 Chronic viral hepatitis C: Secondary | ICD-10-CM

## 2018-05-06 DIAGNOSIS — N281 Cyst of kidney, acquired: Secondary | ICD-10-CM | POA: Diagnosis not present

## 2018-05-22 DIAGNOSIS — B182 Chronic viral hepatitis C: Secondary | ICD-10-CM | POA: Diagnosis not present

## 2018-05-22 DIAGNOSIS — K740 Hepatic fibrosis: Secondary | ICD-10-CM | POA: Diagnosis not present

## 2018-06-12 ENCOUNTER — Other Ambulatory Visit: Payer: Self-pay | Admitting: Family Medicine

## 2018-07-13 NOTE — Telephone Encounter (Signed)
finished

## 2018-08-10 DIAGNOSIS — F1120 Opioid dependence, uncomplicated: Secondary | ICD-10-CM | POA: Diagnosis not present

## 2018-08-11 DIAGNOSIS — F1120 Opioid dependence, uncomplicated: Secondary | ICD-10-CM | POA: Diagnosis not present

## 2018-08-13 DIAGNOSIS — F1120 Opioid dependence, uncomplicated: Secondary | ICD-10-CM | POA: Diagnosis not present

## 2018-08-20 ENCOUNTER — Other Ambulatory Visit: Payer: Self-pay | Admitting: Family Medicine

## 2018-08-20 DIAGNOSIS — B182 Chronic viral hepatitis C: Secondary | ICD-10-CM | POA: Diagnosis not present

## 2018-08-21 DIAGNOSIS — B182 Chronic viral hepatitis C: Secondary | ICD-10-CM | POA: Diagnosis not present

## 2018-08-26 DIAGNOSIS — F1120 Opioid dependence, uncomplicated: Secondary | ICD-10-CM | POA: Diagnosis not present

## 2018-10-19 DIAGNOSIS — F1120 Opioid dependence, uncomplicated: Secondary | ICD-10-CM | POA: Diagnosis not present

## 2018-10-20 ENCOUNTER — Ambulatory Visit: Payer: Medicare HMO | Admitting: Family Medicine

## 2018-10-20 ENCOUNTER — Other Ambulatory Visit: Payer: Self-pay | Admitting: Family Medicine

## 2018-10-23 DIAGNOSIS — F1120 Opioid dependence, uncomplicated: Secondary | ICD-10-CM | POA: Diagnosis not present

## 2018-10-30 DIAGNOSIS — F1120 Opioid dependence, uncomplicated: Secondary | ICD-10-CM | POA: Diagnosis not present

## 2018-11-03 DIAGNOSIS — F1120 Opioid dependence, uncomplicated: Secondary | ICD-10-CM | POA: Diagnosis not present

## 2018-11-04 DIAGNOSIS — F1120 Opioid dependence, uncomplicated: Secondary | ICD-10-CM | POA: Diagnosis not present

## 2018-11-05 DIAGNOSIS — F1120 Opioid dependence, uncomplicated: Secondary | ICD-10-CM | POA: Diagnosis not present

## 2018-11-09 DIAGNOSIS — F1120 Opioid dependence, uncomplicated: Secondary | ICD-10-CM | POA: Diagnosis not present

## 2018-11-10 DIAGNOSIS — F1120 Opioid dependence, uncomplicated: Secondary | ICD-10-CM | POA: Diagnosis not present

## 2018-11-12 DIAGNOSIS — F1120 Opioid dependence, uncomplicated: Secondary | ICD-10-CM | POA: Diagnosis not present

## 2018-11-16 DIAGNOSIS — F1120 Opioid dependence, uncomplicated: Secondary | ICD-10-CM | POA: Diagnosis not present

## 2018-11-17 ENCOUNTER — Other Ambulatory Visit: Payer: Self-pay | Admitting: Family Medicine

## 2018-11-17 DIAGNOSIS — K740 Hepatic fibrosis: Secondary | ICD-10-CM | POA: Diagnosis not present

## 2018-11-17 DIAGNOSIS — B182 Chronic viral hepatitis C: Secondary | ICD-10-CM | POA: Diagnosis not present

## 2018-11-17 DIAGNOSIS — K760 Fatty (change of) liver, not elsewhere classified: Secondary | ICD-10-CM | POA: Diagnosis not present

## 2018-11-23 DIAGNOSIS — F1120 Opioid dependence, uncomplicated: Secondary | ICD-10-CM | POA: Diagnosis not present

## 2018-11-27 DIAGNOSIS — F1120 Opioid dependence, uncomplicated: Secondary | ICD-10-CM | POA: Diagnosis not present

## 2018-11-30 DIAGNOSIS — F1120 Opioid dependence, uncomplicated: Secondary | ICD-10-CM | POA: Diagnosis not present

## 2018-12-04 DIAGNOSIS — F1120 Opioid dependence, uncomplicated: Secondary | ICD-10-CM | POA: Diagnosis not present

## 2018-12-07 DIAGNOSIS — F1120 Opioid dependence, uncomplicated: Secondary | ICD-10-CM | POA: Diagnosis not present

## 2018-12-11 DIAGNOSIS — F1120 Opioid dependence, uncomplicated: Secondary | ICD-10-CM | POA: Diagnosis not present

## 2018-12-18 DIAGNOSIS — F1120 Opioid dependence, uncomplicated: Secondary | ICD-10-CM | POA: Diagnosis not present

## 2018-12-21 DIAGNOSIS — F1120 Opioid dependence, uncomplicated: Secondary | ICD-10-CM | POA: Diagnosis not present

## 2018-12-23 DIAGNOSIS — F1120 Opioid dependence, uncomplicated: Secondary | ICD-10-CM | POA: Diagnosis not present

## 2018-12-24 DIAGNOSIS — F1120 Opioid dependence, uncomplicated: Secondary | ICD-10-CM | POA: Diagnosis not present

## 2018-12-25 DIAGNOSIS — F1120 Opioid dependence, uncomplicated: Secondary | ICD-10-CM | POA: Diagnosis not present

## 2018-12-27 DIAGNOSIS — F1120 Opioid dependence, uncomplicated: Secondary | ICD-10-CM | POA: Diagnosis not present

## 2018-12-28 DIAGNOSIS — F1120 Opioid dependence, uncomplicated: Secondary | ICD-10-CM | POA: Diagnosis not present

## 2018-12-30 DIAGNOSIS — F1120 Opioid dependence, uncomplicated: Secondary | ICD-10-CM | POA: Diagnosis not present

## 2019-01-02 DIAGNOSIS — F1120 Opioid dependence, uncomplicated: Secondary | ICD-10-CM | POA: Diagnosis not present

## 2019-01-04 DIAGNOSIS — F1120 Opioid dependence, uncomplicated: Secondary | ICD-10-CM | POA: Diagnosis not present

## 2019-01-05 DIAGNOSIS — F1120 Opioid dependence, uncomplicated: Secondary | ICD-10-CM | POA: Diagnosis not present

## 2019-01-08 DIAGNOSIS — F1120 Opioid dependence, uncomplicated: Secondary | ICD-10-CM | POA: Diagnosis not present

## 2019-01-09 DIAGNOSIS — F1120 Opioid dependence, uncomplicated: Secondary | ICD-10-CM | POA: Diagnosis not present

## 2019-01-11 DIAGNOSIS — F1120 Opioid dependence, uncomplicated: Secondary | ICD-10-CM | POA: Diagnosis not present

## 2019-01-15 DIAGNOSIS — F1120 Opioid dependence, uncomplicated: Secondary | ICD-10-CM | POA: Diagnosis not present

## 2019-01-16 DIAGNOSIS — F1120 Opioid dependence, uncomplicated: Secondary | ICD-10-CM | POA: Diagnosis not present

## 2019-01-18 DIAGNOSIS — F1120 Opioid dependence, uncomplicated: Secondary | ICD-10-CM | POA: Diagnosis not present

## 2019-01-22 DIAGNOSIS — F1120 Opioid dependence, uncomplicated: Secondary | ICD-10-CM | POA: Diagnosis not present

## 2019-01-23 DIAGNOSIS — F1120 Opioid dependence, uncomplicated: Secondary | ICD-10-CM | POA: Diagnosis not present

## 2019-01-25 DIAGNOSIS — F1120 Opioid dependence, uncomplicated: Secondary | ICD-10-CM | POA: Diagnosis not present

## 2019-01-30 DIAGNOSIS — F1120 Opioid dependence, uncomplicated: Secondary | ICD-10-CM | POA: Diagnosis not present

## 2019-02-01 DIAGNOSIS — F1120 Opioid dependence, uncomplicated: Secondary | ICD-10-CM | POA: Diagnosis not present

## 2019-02-05 DIAGNOSIS — F1120 Opioid dependence, uncomplicated: Secondary | ICD-10-CM | POA: Diagnosis not present

## 2019-02-06 DIAGNOSIS — F1120 Opioid dependence, uncomplicated: Secondary | ICD-10-CM | POA: Diagnosis not present

## 2019-02-08 DIAGNOSIS — F1120 Opioid dependence, uncomplicated: Secondary | ICD-10-CM | POA: Diagnosis not present

## 2019-02-12 ENCOUNTER — Ambulatory Visit: Payer: Medicare HMO | Admitting: Family Medicine

## 2019-02-15 DIAGNOSIS — F1120 Opioid dependence, uncomplicated: Secondary | ICD-10-CM | POA: Diagnosis not present

## 2019-02-18 DIAGNOSIS — F1120 Opioid dependence, uncomplicated: Secondary | ICD-10-CM | POA: Diagnosis not present

## 2019-02-19 DIAGNOSIS — F1120 Opioid dependence, uncomplicated: Secondary | ICD-10-CM | POA: Diagnosis not present

## 2019-02-22 ENCOUNTER — Ambulatory Visit: Payer: Medicare HMO | Admitting: Family Medicine

## 2019-02-22 DIAGNOSIS — F1120 Opioid dependence, uncomplicated: Secondary | ICD-10-CM | POA: Diagnosis not present

## 2019-02-25 DIAGNOSIS — F1120 Opioid dependence, uncomplicated: Secondary | ICD-10-CM | POA: Diagnosis not present

## 2019-02-27 DIAGNOSIS — F1120 Opioid dependence, uncomplicated: Secondary | ICD-10-CM | POA: Diagnosis not present

## 2019-03-01 DIAGNOSIS — F1120 Opioid dependence, uncomplicated: Secondary | ICD-10-CM | POA: Diagnosis not present

## 2019-03-06 DIAGNOSIS — F1120 Opioid dependence, uncomplicated: Secondary | ICD-10-CM | POA: Diagnosis not present

## 2019-03-08 DIAGNOSIS — F1120 Opioid dependence, uncomplicated: Secondary | ICD-10-CM | POA: Diagnosis not present

## 2019-03-12 DIAGNOSIS — F1120 Opioid dependence, uncomplicated: Secondary | ICD-10-CM | POA: Diagnosis not present

## 2019-03-19 DIAGNOSIS — F1120 Opioid dependence, uncomplicated: Secondary | ICD-10-CM | POA: Diagnosis not present

## 2019-03-26 DIAGNOSIS — F1120 Opioid dependence, uncomplicated: Secondary | ICD-10-CM | POA: Diagnosis not present

## 2019-04-01 ENCOUNTER — Telehealth (INDEPENDENT_AMBULATORY_CARE_PROVIDER_SITE_OTHER): Payer: Medicare HMO | Admitting: Family Medicine

## 2019-04-01 DIAGNOSIS — M5441 Lumbago with sciatica, right side: Secondary | ICD-10-CM | POA: Diagnosis not present

## 2019-04-01 DIAGNOSIS — G8929 Other chronic pain: Secondary | ICD-10-CM | POA: Diagnosis not present

## 2019-04-01 MED ORDER — CAPSAICIN 0.075 % EX STCK: CUTANEOUS | 12 refills | Status: DC

## 2019-04-01 MED ORDER — GABAPENTIN 100 MG PO CAPS: 200.0000 mg | ORAL_CAPSULE | Freq: Three times a day (TID) | ORAL | 3 refills | Status: DC

## 2019-04-01 MED ORDER — ACETAMINOPHEN 325 MG RE SUPP: 325.0000 mg | Freq: Three times a day (TID) | RECTAL | Status: DC

## 2019-04-01 NOTE — Progress Notes (Signed)
Called [facility] to televisit Has tried tylenol and aspirin ibuprofen Does not do any good.  Was on oxycodone in the past.  It helped.  Has not been on for a couple of years.   Wants to work in garden. No recent trauma.  Just progressive worsening. Reviewed old MRI from care everywhere.  Shows DDD and facet arthropathy.   He denies leg pain.  No bowel or bladder dysfunction. "Ibuprofen and tylenol tear up my stomach." Has not used any topicals.   Agreed to plan - see problem list. Total time on phone 15 minutes.

## 2019-04-02 DIAGNOSIS — F1120 Opioid dependence, uncomplicated: Secondary | ICD-10-CM | POA: Diagnosis not present

## 2019-04-09 DIAGNOSIS — F1120 Opioid dependence, uncomplicated: Secondary | ICD-10-CM | POA: Diagnosis not present

## 2019-04-16 DIAGNOSIS — F1120 Opioid dependence, uncomplicated: Secondary | ICD-10-CM | POA: Diagnosis not present

## 2019-04-23 DIAGNOSIS — F1120 Opioid dependence, uncomplicated: Secondary | ICD-10-CM | POA: Diagnosis not present

## 2019-04-24 DIAGNOSIS — F1120 Opioid dependence, uncomplicated: Secondary | ICD-10-CM | POA: Diagnosis not present

## 2019-04-30 DIAGNOSIS — F1120 Opioid dependence, uncomplicated: Secondary | ICD-10-CM | POA: Diagnosis not present

## 2019-05-01 DIAGNOSIS — F1120 Opioid dependence, uncomplicated: Secondary | ICD-10-CM | POA: Diagnosis not present

## 2019-05-07 DIAGNOSIS — F1120 Opioid dependence, uncomplicated: Secondary | ICD-10-CM | POA: Diagnosis not present

## 2019-05-08 DIAGNOSIS — F1120 Opioid dependence, uncomplicated: Secondary | ICD-10-CM | POA: Diagnosis not present

## 2019-05-14 DIAGNOSIS — F1120 Opioid dependence, uncomplicated: Secondary | ICD-10-CM | POA: Diagnosis not present

## 2019-05-20 DIAGNOSIS — B182 Chronic viral hepatitis C: Secondary | ICD-10-CM | POA: Diagnosis not present

## 2019-05-20 DIAGNOSIS — K760 Fatty (change of) liver, not elsewhere classified: Secondary | ICD-10-CM | POA: Diagnosis not present

## 2019-05-21 DIAGNOSIS — F1120 Opioid dependence, uncomplicated: Secondary | ICD-10-CM | POA: Diagnosis not present

## 2019-05-26 DIAGNOSIS — F1120 Opioid dependence, uncomplicated: Secondary | ICD-10-CM | POA: Diagnosis not present

## 2019-05-28 ENCOUNTER — Other Ambulatory Visit: Payer: Self-pay | Admitting: Nurse Practitioner

## 2019-05-28 DIAGNOSIS — K760 Fatty (change of) liver, not elsewhere classified: Secondary | ICD-10-CM

## 2019-06-02 DIAGNOSIS — F1120 Opioid dependence, uncomplicated: Secondary | ICD-10-CM | POA: Diagnosis not present

## 2019-06-04 DIAGNOSIS — F1120 Opioid dependence, uncomplicated: Secondary | ICD-10-CM | POA: Diagnosis not present

## 2019-06-09 DIAGNOSIS — F1120 Opioid dependence, uncomplicated: Secondary | ICD-10-CM | POA: Diagnosis not present

## 2019-06-16 DIAGNOSIS — F1120 Opioid dependence, uncomplicated: Secondary | ICD-10-CM | POA: Diagnosis not present

## 2019-06-21 DIAGNOSIS — F1120 Opioid dependence, uncomplicated: Secondary | ICD-10-CM | POA: Diagnosis not present

## 2019-06-22 DIAGNOSIS — F1120 Opioid dependence, uncomplicated: Secondary | ICD-10-CM | POA: Diagnosis not present

## 2019-06-23 DIAGNOSIS — F1120 Opioid dependence, uncomplicated: Secondary | ICD-10-CM | POA: Diagnosis not present

## 2019-06-24 DIAGNOSIS — F1120 Opioid dependence, uncomplicated: Secondary | ICD-10-CM | POA: Diagnosis not present

## 2019-06-25 DIAGNOSIS — F1120 Opioid dependence, uncomplicated: Secondary | ICD-10-CM | POA: Diagnosis not present

## 2019-06-26 DIAGNOSIS — F1120 Opioid dependence, uncomplicated: Secondary | ICD-10-CM | POA: Diagnosis not present

## 2019-06-28 DIAGNOSIS — F1120 Opioid dependence, uncomplicated: Secondary | ICD-10-CM | POA: Diagnosis not present

## 2019-07-03 DIAGNOSIS — F1120 Opioid dependence, uncomplicated: Secondary | ICD-10-CM | POA: Diagnosis not present

## 2019-07-06 DIAGNOSIS — F1120 Opioid dependence, uncomplicated: Secondary | ICD-10-CM | POA: Diagnosis not present

## 2019-07-10 DIAGNOSIS — F1120 Opioid dependence, uncomplicated: Secondary | ICD-10-CM | POA: Diagnosis not present

## 2019-07-13 DIAGNOSIS — F1120 Opioid dependence, uncomplicated: Secondary | ICD-10-CM | POA: Diagnosis not present

## 2019-07-17 ENCOUNTER — Emergency Department (HOSPITAL_COMMUNITY): Payer: Medicare HMO

## 2019-07-17 ENCOUNTER — Encounter (HOSPITAL_COMMUNITY): Payer: Self-pay | Admitting: Radiology

## 2019-07-17 ENCOUNTER — Inpatient Hospital Stay (HOSPITAL_COMMUNITY)
Admission: EM | Admit: 2019-07-17 | Discharge: 2019-07-20 | DRG: 041 | Disposition: A | Discharge status: Home Health | Payer: Medicare HMO | Attending: Neurology | Admitting: Neurology

## 2019-07-17 ENCOUNTER — Other Ambulatory Visit: Payer: Self-pay

## 2019-07-17 DIAGNOSIS — R29707 NIHSS score 7: Secondary | ICD-10-CM | POA: Diagnosis present

## 2019-07-17 DIAGNOSIS — Z823 Family history of stroke: Secondary | ICD-10-CM

## 2019-07-17 DIAGNOSIS — M5441 Lumbago with sciatica, right side: Secondary | ICD-10-CM | POA: Diagnosis present

## 2019-07-17 DIAGNOSIS — G8191 Hemiplegia, unspecified affecting right dominant side: Secondary | ICD-10-CM | POA: Diagnosis not present

## 2019-07-17 DIAGNOSIS — I739 Peripheral vascular disease, unspecified: Secondary | ICD-10-CM | POA: Diagnosis not present

## 2019-07-17 DIAGNOSIS — Z79891 Long term (current) use of opiate analgesic: Secondary | ICD-10-CM | POA: Diagnosis not present

## 2019-07-17 DIAGNOSIS — I63422 Cerebral infarction due to embolism of left anterior cerebral artery: Principal | ICD-10-CM | POA: Diagnosis present

## 2019-07-17 DIAGNOSIS — I10 Essential (primary) hypertension: Secondary | ICD-10-CM | POA: Diagnosis not present

## 2019-07-17 DIAGNOSIS — R0902 Hypoxemia: Secondary | ICD-10-CM | POA: Diagnosis not present

## 2019-07-17 DIAGNOSIS — Q2112 Patent foramen ovale: Secondary | ICD-10-CM

## 2019-07-17 DIAGNOSIS — Z6836 Body mass index (BMI) 36.0-36.9, adult: Secondary | ICD-10-CM | POA: Diagnosis not present

## 2019-07-17 DIAGNOSIS — B182 Chronic viral hepatitis C: Secondary | ICD-10-CM | POA: Diagnosis present

## 2019-07-17 DIAGNOSIS — W19XXXA Unspecified fall, initial encounter: Secondary | ICD-10-CM | POA: Diagnosis not present

## 2019-07-17 DIAGNOSIS — D696 Thrombocytopenia, unspecified: Secondary | ICD-10-CM | POA: Diagnosis not present

## 2019-07-17 DIAGNOSIS — R29818 Other symptoms and signs involving the nervous system: Secondary | ICD-10-CM | POA: Diagnosis not present

## 2019-07-17 DIAGNOSIS — I639 Cerebral infarction, unspecified: Secondary | ICD-10-CM | POA: Diagnosis not present

## 2019-07-17 DIAGNOSIS — E669 Obesity, unspecified: Secondary | ICD-10-CM | POA: Diagnosis present

## 2019-07-17 DIAGNOSIS — M545 Low back pain, unspecified: Secondary | ICD-10-CM | POA: Diagnosis present

## 2019-07-17 DIAGNOSIS — I63412 Cerebral infarction due to embolism of left middle cerebral artery: Secondary | ICD-10-CM | POA: Diagnosis present

## 2019-07-17 DIAGNOSIS — E039 Hypothyroidism, unspecified: Secondary | ICD-10-CM | POA: Diagnosis present

## 2019-07-17 DIAGNOSIS — R54 Age-related physical debility: Secondary | ICD-10-CM | POA: Diagnosis present

## 2019-07-17 DIAGNOSIS — Z20828 Contact with and (suspected) exposure to other viral communicable diseases: Secondary | ICD-10-CM | POA: Diagnosis not present

## 2019-07-17 DIAGNOSIS — Z8349 Family history of other endocrine, nutritional and metabolic diseases: Secondary | ICD-10-CM

## 2019-07-17 DIAGNOSIS — R531 Weakness: Secondary | ICD-10-CM | POA: Diagnosis not present

## 2019-07-17 DIAGNOSIS — Z6830 Body mass index (BMI) 30.0-30.9, adult: Secondary | ICD-10-CM | POA: Diagnosis not present

## 2019-07-17 DIAGNOSIS — E785 Hyperlipidemia, unspecified: Secondary | ICD-10-CM | POA: Diagnosis not present

## 2019-07-17 DIAGNOSIS — Z03818 Encounter for observation for suspected exposure to other biological agents ruled out: Secondary | ICD-10-CM | POA: Diagnosis not present

## 2019-07-17 DIAGNOSIS — Z8249 Family history of ischemic heart disease and other diseases of the circulatory system: Secondary | ICD-10-CM

## 2019-07-17 DIAGNOSIS — G8929 Other chronic pain: Secondary | ICD-10-CM | POA: Diagnosis present

## 2019-07-17 DIAGNOSIS — E876 Hypokalemia: Secondary | ICD-10-CM | POA: Diagnosis not present

## 2019-07-17 DIAGNOSIS — I6340 Cerebral infarction due to embolism of unspecified cerebral artery: Secondary | ICD-10-CM | POA: Diagnosis not present

## 2019-07-17 DIAGNOSIS — E6609 Other obesity due to excess calories: Secondary | ICD-10-CM | POA: Diagnosis not present

## 2019-07-17 DIAGNOSIS — Z981 Arthrodesis status: Secondary | ICD-10-CM | POA: Diagnosis not present

## 2019-07-17 DIAGNOSIS — Q211 Atrial septal defect: Secondary | ICD-10-CM | POA: Diagnosis not present

## 2019-07-17 DIAGNOSIS — Z79890 Hormone replacement therapy (postmenopausal): Secondary | ICD-10-CM

## 2019-07-17 DIAGNOSIS — Z95818 Presence of other cardiac implants and grafts: Secondary | ICD-10-CM | POA: Diagnosis not present

## 2019-07-17 DIAGNOSIS — R29810 Facial weakness: Secondary | ICD-10-CM | POA: Diagnosis not present

## 2019-07-17 DIAGNOSIS — I6389 Other cerebral infarction: Secondary | ICD-10-CM | POA: Diagnosis not present

## 2019-07-17 LAB — SARS CORONAVIRUS 2 BY RT PCR (HOSPITAL ORDER, PERFORMED IN ~~LOC~~ HOSPITAL LAB): SARS Coronavirus 2: NEGATIVE

## 2019-07-17 LAB — COMPREHENSIVE METABOLIC PANEL
ALT: 18 U/L (ref 0–44)
AST: 22 U/L (ref 15–41)
Albumin: 4 g/dL (ref 3.5–5.0)
Alkaline Phosphatase: 63 U/L (ref 38–126)
Anion gap: 10 (ref 5–15)
BUN: 12 mg/dL (ref 8–23)
CO2: 23 mmol/L (ref 22–32)
Calcium: 9.2 mg/dL (ref 8.9–10.3)
Chloride: 104 mmol/L (ref 98–111)
Creatinine, Ser: 1.17 mg/dL (ref 0.61–1.24)
GFR calc Af Amer: >60 mL/min (ref >60)
GFR calc non Af Amer: >60 mL/min (ref >60)
Glucose, Bld: 104 mg/dL — ABNORMAL HIGH (ref 70–99)
Potassium: 4 mmol/L (ref 3.5–5.1)
Sodium: 137 mmol/L (ref 135–145)
Total Bilirubin: 0.5 mg/dL (ref 0.3–1.2)
Total Protein: 7.8 g/dL (ref 6.5–8.1)

## 2019-07-17 LAB — I-STAT CHEM 8, ED
BUN: 12 mg/dL (ref 8–23)
Calcium, Ion: 1.1 mmol/L — ABNORMAL LOW (ref 1.15–1.40)
Chloride: 104 mmol/L (ref 98–111)
Creatinine, Ser: 1.1 mg/dL (ref 0.61–1.24)
Glucose, Bld: 101 mg/dL — ABNORMAL HIGH (ref 70–99)
HCT: 40 % (ref 39.0–52.0)
Hemoglobin: 13.6 g/dL (ref 13.0–17.0)
Potassium: 4 mmol/L (ref 3.5–5.1)
Sodium: 138 mmol/L (ref 135–145)
TCO2: 26 mmol/L (ref 22–32)

## 2019-07-17 LAB — CBC
HCT: 40.7 % (ref 39.0–52.0)
Hemoglobin: 12.8 g/dL — ABNORMAL LOW (ref 13.0–17.0)
MCH: 28.6 pg (ref 26.0–34.0)
MCHC: 31.4 g/dL (ref 30.0–36.0)
MCV: 90.8 fL (ref 80.0–100.0)
Platelets: 183 10*3/uL (ref 150–400)
RBC: 4.48 MIL/uL (ref 4.22–5.81)
RDW: 14.1 % (ref 11.5–15.5)
WBC: 7.9 10*3/uL (ref 4.0–10.5)
nRBC: 0 % (ref 0.0–0.2)

## 2019-07-17 LAB — RAPID URINE DRUG SCREEN, HOSP PERFORMED
Amphetamines: NOT DETECTED
Barbiturates: NOT DETECTED
Benzodiazepines: NOT DETECTED
Cocaine: NOT DETECTED
Opiates: NOT DETECTED
Tetrahydrocannabinol: NOT DETECTED

## 2019-07-17 LAB — DIFFERENTIAL
Abs Immature Granulocytes: 0.01 10*3/uL (ref 0.00–0.07)
Basophils Absolute: 0 10*3/uL (ref 0.0–0.1)
Basophils Relative: 1 %
Eosinophils Absolute: 0.1 10*3/uL (ref 0.0–0.5)
Eosinophils Relative: 2 %
Immature Granulocytes: 0 %
Lymphocytes Relative: 18 %
Lymphs Abs: 1.4 10*3/uL (ref 0.7–4.0)
Monocytes Absolute: 0.6 10*3/uL (ref 0.1–1.0)
Monocytes Relative: 7 %
Neutro Abs: 5.7 10*3/uL (ref 1.7–7.7)
Neutrophils Relative %: 72 %

## 2019-07-17 LAB — URINALYSIS, COMPLETE (UACMP) WITH MICROSCOPIC
Bacteria, UA: NONE SEEN
Bilirubin Urine: NEGATIVE
Glucose, UA: NEGATIVE mg/dL
Ketones, ur: NEGATIVE mg/dL
Leukocytes,Ua: NEGATIVE
Nitrite: NEGATIVE
Protein, ur: NEGATIVE mg/dL
Specific Gravity, Urine: 1.038 — ABNORMAL HIGH (ref 1.005–1.030)
pH: 6 (ref 5.0–8.0)

## 2019-07-17 LAB — GLUCOSE, CAPILLARY
Glucose-Capillary: 73 mg/dL (ref 70–99)
Glucose-Capillary: 92 mg/dL (ref 70–99)

## 2019-07-17 LAB — PROTIME-INR
INR: 1.1 (ref 0.8–1.2)
Prothrombin Time: 13.8 seconds (ref 11.4–15.2)

## 2019-07-17 LAB — APTT: aPTT: 26 seconds (ref 24–36)

## 2019-07-17 LAB — CBG MONITORING, ED: Glucose-Capillary: 94 mg/dL (ref 70–99)

## 2019-07-17 MED ORDER — SODIUM CHLORIDE 0.9% FLUSH
3.0000 mL | Freq: Once | INTRAVENOUS | Status: DC
Start: 2019-07-17 — End: 2019-07-21

## 2019-07-17 MED ORDER — LABETALOL HCL 5 MG/ML IV SOLN: 10.0000 mg | INTRAVENOUS | Status: DC | PRN

## 2019-07-17 MED ORDER — SODIUM CHLORIDE 0.9 % IV SOLN
INTRAVENOUS | Status: DC
  Administered 2019-07-17: 20:00:00 via INTRAVENOUS

## 2019-07-17 MED ORDER — SODIUM CHLORIDE 0.9 % IV SOLN
50.0000 mL | Freq: Once | INTRAVENOUS | Status: AC
  Administered 2019-07-17: 50 mL via INTRAVENOUS

## 2019-07-17 MED ORDER — ALTEPLASE (STROKE) FULL DOSE INFUSION
90.0000 mg | Freq: Once | INTRAVENOUS | Status: AC
  Administered 2019-07-17: 90 mg via INTRAVENOUS
  Filled 2019-07-17: qty 100

## 2019-07-17 MED ORDER — IOHEXOL 350 MG/ML SOLN
90.0000 mL | Freq: Once | INTRAVENOUS | Status: AC | PRN
  Administered 2019-07-17: 90 mL via INTRAVENOUS

## 2019-07-17 MED ORDER — STROKE: EARLY STAGES OF RECOVERY BOOK
Freq: Once | Status: AC
  Administered 2019-07-17
  Filled 2019-07-17: qty 1

## 2019-07-17 MED ORDER — ACETAMINOPHEN 325 MG PO TABS: 650.0000 mg | ORAL_TABLET | ORAL | Status: DC | PRN

## 2019-07-17 MED ORDER — PANTOPRAZOLE SODIUM 40 MG IV SOLR
40.0000 mg | Freq: Every day | INTRAVENOUS | Status: DC
  Administered 2019-07-17: 40 mg via INTRAVENOUS
  Filled 2019-07-17: qty 40

## 2019-07-17 MED ORDER — ACETAMINOPHEN 650 MG RE SUPP: 650.0000 mg | RECTAL | Status: DC | PRN

## 2019-07-17 MED ORDER — ACETAMINOPHEN 160 MG/5ML PO SOLN: 650.0000 mg | ORAL | Status: DC | PRN

## 2019-07-17 NOTE — ED Notes (Signed)
Patient symptoms improved. Neuro informs RN to hold TPA until CT angio completed.

## 2019-07-17 NOTE — H&P (Addendum)
Admission H&P    Chief Complaint: Code Stroke HPI: Ricky Walter is an 63 y.o. male with HTN, HLD, obesity, anxiety, arthritis with chronic back pain and thyroid dz who presents to ER with sudden onset of right leg weakness while walking in his front yard at 1600. This caused a fall, no apparent injury or complaint of pain at this time. Upon initial evaluation emergently at the EMS bridge, pt had no movement in the right leg, mild neglect and sensory loss in left leg more than arm. He was able follow commands and had no speech or CN deficits. NIHSS 7. CTH showed no acute changes. There was some delay getting CTA/P due to poor IV access, which required the ER doctor to place line with ultrasound. CTA showed no LVO. CTP showed large perfusion deficit in the L ACA with no core. He does not take any blood thinners and there were no CIs to IV tPA. The risks and benefits were discussed with pt and he consented to thrombolytic treatment.   LSN: 07/17/19 1600 tPA Given: 1830  Past Medical History Past Medical History:  Diagnosis Date  . Anxiety   . Arthritis   . Chronic back pain 08/07/2015  . Hyperlipidemia   . Hypertension   . Thyroid disease     Past Surgical History Past Surgical History:  Procedure Laterality Date  . ANTERIOR FUSION CERVICAL SPINE      Family History Family History  Problem Relation Age of Onset  . Hyperlipidemia Mother   . Hypertension Mother   . Thyroid disease Mother   . Hyperlipidemia Father   . Hypertension Father   . Stroke Father     Social History  reports that he has never smoked. He has never used smokeless tobacco. He reports that he does not drink alcohol or use drugs.  Allergies Allergies  Allergen Reactions  . Duloxetine     Nausea and drowsy.    Home Medications (Not in a hospital admission)   Hospital Medications . sodium chloride flush  3 mL Intravenous Once    ROS: History obtained from pt  General ROS: negative for - chills,  fatigue, fever, night sweats, weight gain or weight loss Psychological ROS: negative for - behavioral disorder, hallucinations, memory difficulties, mood swings or suicidal ideation Ophthalmic ROS: negative for - blurry vision, double vision, eye pain or loss of vision ENT ROS: negative for - epistaxis, nasal discharge, oral lesions, sore throat, tinnitus or vertigo Allergy and Immunology ROS: negative for - hives or itchy/watery eyes Hematological and Lymphatic ROS: negative for - bleeding problems, bruising or swollen lymph nodes Endocrine ROS: negative for - galactorrhea, hair pattern changes, polydipsia/polyuria or temperature intolerance Respiratory ROS: negative for - cough, hemoptysis, shortness of breath or wheezing Cardiovascular ROS: negative for - chest pain, dyspnea on exertion, edema or irregular heartbeat Gastrointestinal ROS: negative for - abdominal pain, diarrhea, hematemesis, nausea/vomiting or stool incontinence Genito-Urinary ROS: negative for - dysuria, hematuria, incontinence or urinary frequency/urgency Musculoskeletal ROS: negative for - joint swelling or muscular weakness Neurological ROS: as noted in HPI Dermatological ROS: negative for rash and skin lesion changes   Physical Examination:  Vitals:   07/17/19 1700  Weight: 122.7 kg    General - obese; distressed at this time Heart - Regular rate and rhythm - no murmer Lungs - Clear to auscultation Abdomen - Soft - non tender Extremities - Distal pulses intact - no edema Skin - Warm and dry   Neurologic Examination:  Mental Status:  Alert, oriented, thought content appropriate. Speech without evidence of dysarthria or aphasia. Able to follow 3 step commands without difficulty, but this was slow.  Cranial Nerves:  II-bilateral visual fields intact III/IV/VI-Pupils were equal and reacted. Extraocular movements were full.  V/VII-no facial numbness and no facial weakness.  VIII-hearing normal.  X-normal  speech and symmetrical palatal movement.  XII-midline tongue extension  Motor: Right : Upper extremity   5/5    Left:     Upper extremity   4+/5 (pt reports arthrits in hands)  Lower extremity   5/5     Lower extremity   0/5 Tone and bulk:normal tone throughout; no atrophy noted Sensory: Says there is a "heavy sensation", there is extinction with bilat stimuli Deep Tendon Reflexes: 2/4 throughout Plantars: Downgoing bilaterally  Cerebellar: Normal FNF and heel to shin bilaterally. Gait: not tested NIHSS 1a Level of Conscious.: 0 1b LOC Questions: 0 1c LOC Commands: 0 2 Best Gaze: 0 3 Visual: 0 4 Facial Palsy: 0 5a Motor Arm - Left:0 5b Motor Arm - Right: 1 6a Motor Leg - Left: 0 6b Motor Leg - Right: 4 7 Limb Ataxia: 0 8 Sensory: 1 9 Best Language: 0 10 Dysarthria: 0 11 Extinct. and Inatten.: 1 TOTAL: 7   LABORATORY STUDIES   Basic Metabolic Panel: Recent Labs  Lab 07/17/19 1747  NA 138  K 4.0  CL 104  GLUCOSE 101*  BUN 12  CREATININE 1.10    Liver Function Tests: No results for input(s): AST, ALT, ALKPHOS, BILITOT, PROT, ALBUMIN in the last 168 hours. No results for input(s): LIPASE, AMYLASE in the last 168 hours. No results for input(s): AMMONIA in the last 168 hours.  CBC: Recent Labs  Lab 07/17/19 1743 07/17/19 1747  WBC 7.9  --   NEUTROABS 5.7  --   HGB 12.8* 13.6  HCT 40.7 40.0  MCV 90.8  --   PLT 183  --     Cardiac Enzymes: No results for input(s): CKTOTAL, CKMB, CKMBINDEX, TROPONINI in the last 168 hours.  BNP: Invalid input(s): POCBNP  CBG: Recent Labs  Lab 07/17/19 1743  GLUCAP 94    Microbiology:   Coagulation Studies: Recent Labs    07/17/19 1743  LABPROT 13.8  INR 1.1    Urinalysis: No results for input(s): COLORURINE, LABSPEC, PHURINE, GLUCOSEU, HGBUR, BILIRUBINUR, KETONESUR, PROTEINUR, UROBILINOGEN, NITRITE, LEUKOCYTESUR in the last 168 hours.  Invalid input(s): APPERANCEUR  Lipid Panel:     Component Value  Date/Time   CHOL 168 08/06/2017 1244   TRIG 96 08/06/2017 1244   HDL 54 08/06/2017 1244   CHOLHDL 3.1 08/06/2017 1244   LDLCALC 95 08/06/2017 1244    HgbA1C:  No results found for: HGBA1C  Urine Drug Screen:  No results found for: LABOPIA, COCAINSCRNUR, LABBENZ, AMPHETMU, THCU, LABBARB   Alcohol Level:  No results for input(s): ETH in the last 168 hours.  IMAGING Ct Head Code Stroke Wo Contrast  Result Date: 07/17/2019 CLINICAL DATA:  Code stroke. Sudden onset of right upper extremity weakness. EXAM: CT HEAD WITHOUT CONTRAST TECHNIQUE: Contiguous axial images were obtained from the base of the skull through the vertex without intravenous contrast. COMPARISON:  None. FINDINGS: Brain: No acute infarct, hemorrhage, or mass lesion is present. No significant white matter lesions are present. The ventricles are of normal size. No significant extraaxial fluid collection is present. The brainstem and cerebellum are within normal limits. Vascular: There is a possible hyperdense left MCA near the bifurcation.  No significant vascular calcifications are present. Skull: Calvarium is intact. No focal lytic or blastic lesions are present. Sinuses/Orbits: The paranasal sinuses and mastoid air cells are clear. The globes and orbits are within normal limits. ASPECTS [facility] Stroke Program Early CT Score) - Ganglionic level infarction (caudate, lentiform nuclei, internal capsule, insula, M1-M3 cortex): 7/7 - Supraganglionic infarction (M4-M6 cortex): 3/3 Total score (0-10 with 10 being normal): 10/10 IMPRESSION: 1. Possible hyperdense left MCA near the bifurcation. 2. No focal cortical abnormalities. 3. ASPECTS is 10/10 The above was relayed via text pager to Dr. Lorraine Lax on 07/17/2019 at 18:07 . Electronically Signed   By: San Morelle M.D.   On: 07/17/2019 18:07    Assessment: 63 y.o. male with HTN, HLD, obesity, anxiety, arthritis with chronic back pain and thyroid dz who presents to ER with sudden onset  of right leg weakness while walking in his front yard at 1600. This caused a fall, no acute injury. CTH without acute change. CTA showed no LVO. CTP deficit seen in the L ACA. He was treated with IV tPA  Stroke Risk Factors - HTN, HLD, obesity  # Acute Ischemic Stroke- presented with dense right leg weakness. CTP shows large perfusion deficit in L ACA.  # HTN- permissive to SBP 180 post tPA # HLD- lipids pending, goal <70  Plan:  Admit to neuro ICU, greater than 2MN for stroke wk up  HgbA1c, fasting lipid panel  MRI of the brain without contrast for f/u imaging tomorrow  PT consult, OT consult, Speech consult  Echocardiogram  SCDs for DVT ppx  Hold any antiplatelets till f/u post tPA imaging  Risk factor modification  Telemetry monitoring  Frequent neuro checks  Attending Neurologist's Note to Follow  Desiree Metzger-Cihelka, ARNP-C, ANVP-BC Pager: [phone]   NEUROHOSPITALIST ADDENDUM Performed a face to face diagnostic evaluation.   I have reviewed the contents of history and physical exam as documented by PA/ARNP/Resident and agree with above documentation.  I have discussed and formulated the above plan as documented. Edits to the note have been made as needed.  I evaluated the patient as a code stroke.  Presented with predominantly right leg weakness that began at 4 PM when in his front yard.  Stat CT head was obtained which showed no hemorrhage.  Discussed with patient to administer TPA.  Symptoms were improving so we decided to hold TPA.  However after obtaining CTA and CT perfusion there was clear perfusion deficit in the ACA territory and after explaining benefit versus risk patient agreed to receive IV TPA. Patient had no contraindications.  Etiology of stroke likely atherosclerotic disease however evaluation still pending.  Patient will be admitted to neuro ICU.  MRI brain when stable.  Echocardiogram.  A1c and lipid profile.   This patient is  neurologically critically ill due to [place] stroke.  He is at risk for significant risk of neurological worsening from cerebral edema,  death from brain herniation, heart failure, hemorrhagic conversion, infection, respiratory failure and seizure. This patient's care requires constant monitoring of vital signs, hemodynamics, respiratory and cardiac monitoring, review of multiple databases, neurological assessment, discussion with family, other specialists and medical decision making of high complexity.  I spent 40  minutes of neurocritical time in the care of this patient independent of nurse practitioner.     Karena Addison  MD [facility] [phone]   If 7pm to 7am, please call on call as listed on AMION.

## 2019-07-17 NOTE — ED Notes (Signed)
Wife- Angela Nevin [phone] would like an update

## 2019-07-17 NOTE — ED Triage Notes (Signed)
Pt here  Via [facility] home after acute onset R sided weakness, causing him to fall.  Upon arrival, AO x 4 with R sided deficits.

## 2019-07-17 NOTE — ED Provider Notes (Signed)
a [facility] EMERGENCY DEPARTMENT Provider Note   CSN: [id number] Arrival date & time: 07/17/19  1737  An emergency department physician performed an initial assessment on this suspected stroke patient at 1739.LEVEL 5 CAVEAT - ACUITY OF CONDITION  History   Chief Complaint No chief complaint on file.   HPI Ricky Walter is a 63 y.o. male.     HPI  63 year old male presents as a code stroke.  History from patient and EMS.  Patient does not 100% sure when this started but EMS reports around 4 PM.  He fell and that is how all of his symptoms started.  There is possible confusion earlier in the day but he was also able to drive family around.  The fall appears to be around 4 PM and patient states that his leg became numb.  Unable to move his right leg and it has been flaccid.  Some right upper extremity weakness.  No headache.  Past Medical History:  Diagnosis Date   Anxiety    Arthritis    Chronic back pain 08/07/2015   Hyperlipidemia    Hypertension    Thyroid disease     Patient Active Problem List   Diagnosis Date Noted   Stroke (cerebrum) ([place]) 07/17/2019   Chronic hepatitis C without hepatic coma ([place]) 09/25/2017   Mood disorder of depressed type 08/06/2017   Chronic right-sided low back pain with right-sided sciatica 08/06/2017   Hypothyroidism 08/06/2017   Erectile dysfunction 08/06/2017    Past Surgical History:  Procedure Laterality Date   ANTERIOR FUSION CERVICAL SPINE          Home Medications    Prior to Admission medications   Medication Sig Start Date End Date Taking? Authorizing Provider  acetaminophen (TYLENOL) 325 MG suppository Place 1 suppository (325 mg total) rectally 3 (three) times daily. 04/01/19   Zenia Resides, MD  amitriptyline (ELAVIL) 10 MG tablet TAKE 1 TABLET (10 MG TOTAL) BY MOUTH AT BEDTIME. 11/17/18   Nuala Alpha, DO  Capsaicin 0.075 % STCK Apply to low back 2 x daily 04/01/19   Zenia Resides,  MD  gabapentin (NEURONTIN) 100 MG capsule Take 2 capsules (200 mg total) by mouth 3 (three) times daily. 04/01/19   Zenia Resides, MD  levothyroxine (SYNTHROID, LEVOTHROID) 150 MCG tablet TAKE 1 TABLET (150 MCG TOTAL) BY MOUTH DAILY. 08/20/18   Nuala Alpha, DO  sildenafil (REVATIO) 20 MG tablet Take 5 tablets (100 mg) at once 1 hour prior to sexual activity as needed. Do not exceed 100 mg daily. 09/25/17   [place] Bing, DO    Family History Family History  Problem Relation Age of Onset   Hyperlipidemia Mother    Hypertension Mother    Thyroid disease Mother    Hyperlipidemia Father    Hypertension Father    Stroke Father     Social History Social History   Tobacco Use   Smoking status: Never Smoker   Smokeless tobacco: Never Used  Substance Use Topics   Alcohol use: No   Drug use: No     Allergies   Duloxetine   Review of Systems Review of Systems  Unable to perform ROS: Acuity of condition     Physical Exam Updated Vital Signs BP (!) 131/95    Pulse 61    Temp 98.4 F (36.9 C) (Oral)    Resp 14    Ht 5\' 11"  (1.803 m)    Wt 118.3 kg  SpO2 99%    BMI 36.37 kg/m   Physical Exam Vitals signs and nursing note reviewed.  Constitutional:      Appearance: He is well-developed. He is obese.  HENT:     Head: Normocephalic and atraumatic.     Right Ear: External ear normal.     Left Ear: External ear normal.     Nose: Nose normal.  Eyes:     General:        Right eye: No discharge.        Left eye: No discharge.  Neck:     Musculoskeletal: Neck supple.  Cardiovascular:     Rate and Rhythm: Normal rate and regular rhythm.     Heart sounds: Normal heart sounds.  Pulmonary:     Effort: Pulmonary effort is normal.     Breath sounds: Normal breath sounds.  Abdominal:     Palpations: Abdomen is soft.     Tenderness: There is no abdominal tenderness.  Skin:    General: Skin is warm and dry.  Neurological:     Mental Status: He is alert.      Comments: Awake, alert, no slurred speech or facial droop. 5/5 strength LUE, LLE. 4/5 RUE. Flaccid RLE. Decreased sensation to RLE  Psychiatric:        Mood and Affect: Mood is not anxious.      ED Treatments / Results  Labs (all labs ordered are listed, but only abnormal results are displayed) Labs Reviewed  CBC - Abnormal; Notable for the following components:      Result Value   Hemoglobin 12.8 (*)    All other components within normal limits  COMPREHENSIVE METABOLIC PANEL - Abnormal; Notable for the following components:   Glucose, Bld 104 (*)    All other components within normal limits  URINALYSIS, COMPLETE (UACMP) WITH MICROSCOPIC - Abnormal; Notable for the following components:   Color, Urine STRAW (*)    Specific Gravity, Urine 1.038 (*)    Hgb urine dipstick SMALL (*)    All other components within normal limits  I-STAT CHEM 8, ED - Abnormal; Notable for the following components:   Glucose, Bld 101 (*)    Calcium, Ion 1.10 (*)    All other components within normal limits  SARS CORONAVIRUS 2 (HOSPITAL ORDER, PERFORMED IN [facility])  MRSA PCR SCREENING  PROTIME-INR  APTT  DIFFERENTIAL  RAPID URINE DRUG SCREEN, HOSP PERFORMED  GLUCOSE, CAPILLARY  GLUCOSE, CAPILLARY  HIV ANTIBODY (ROUTINE TESTING W REFLEX)  HEMOGLOBIN A1C  LIPID PANEL  CBC  BASIC METABOLIC PANEL  CBG MONITORING, ED    EKG EKG Interpretation  Date/Time:  Saturday July 17 2019 18:33:05 EDT Ventricular Rate:  83 PR Interval:    QRS Duration: 103 QT Interval:  460 QTC Calculation: 541 R Axis:   36 Text Interpretation:  Sinus rhythm Abnormal R-wave progression, early transition Prolonged QT interval No old tracing to compare Confirmed by Pricilla LovelessGoldston, Enjoli Tidd [phone]([id number]) on 07/17/2019 6:37:40 PM   Radiology Ct Code Stroke Cta Head W/wo Contrast  Result Date: 07/17/2019 CLINICAL DATA:  Sudden onset of right upper extremity weakness. EXAM: CT ANGIOGRAPHY HEAD AND NECK CT PERFUSION BRAIN  TECHNIQUE: Multidetector CT imaging of the head and neck was performed using the standard protocol during bolus administration of intravenous contrast. Multiplanar CT image reconstructions and MIPs were obtained to evaluate the vascular anatomy. Carotid stenosis measurements (when applicable) are obtained utilizing NASCET criteria, using the distal internal carotid diameter as the  denominator. Multiphase CT imaging of the brain was performed following IV bolus contrast injection. Subsequent parametric perfusion maps were calculated using RAPID software. CONTRAST:  90mL OMNIPAQUE IOHEXOL 350 MG/ML SOLN COMPARISON:  CT head without contrast of the same day. FINDINGS: CTA NECK FINDINGS Aortic arch: There is a common origin of the left common carotid artery and innominate artery. No significant vascular calcifications or stenosis are present at the aortic arch. Right carotid system: The right common carotid artery within normal limits. Right carotid bifurcation is unremarkable. Cervical right ICA is normal. Left carotid system: The left common carotid artery is within normal limits. Minimal atherosclerotic changes are present bifurcation. There is no significant stenosis. The cervical left ICA is normal. Vertebral arteries: The vertebral arteries originate from the subclavian arteries bilaterally. There is no significant stenosis at the origins. No focal stenosis or vascular injury is present in either vertebral artery in the neck. Skeleton: Cervical spine fusion is noted at C3-4, C4-5, and C5-6. Chronic endplate changes are present at C6-7 and C7-T1. Chronic endplate changes are present at C2-3. No focal lytic blastic lesions are present. Patient has 2 residual mandibular teeth, both with chronic periapical lucencies. Other neck: No focal mucosal or submucosal lesions are present. The salivary glands are within normal limits. Thyroid is normal. No significant adenopathy is present. Upper chest: Lung apices are clear  without focal nodule, mass, or airspace disease. Thoracic inlet is within normal limits. The upper mediastinum is unremarkable. Review of the MIP images confirms the above findings CTA HEAD FINDINGS Anterior circulation: Minimal atherosclerotic changes are present within the scratched at the internal carotid arteries are within normal limits from the high cervical segments through the ICA termini bilaterally. The A1 and M1 segments are normal. The left A1 segment is dominant. MCA bifurcations are within normal limits. The MCA branch vessels are normal bilaterally. No significant stenosis or occlusion is present. There is mild irregularity of distal ACA branch vessels without a significant focal stenosis or occlusion. Posterior circulation: The vertebral arteries are codominant. PICA origins are visualized and normal. Basilar artery is normal. Both posterior cerebral arteries originate from the basilar tip. Small posterior communicating arteries are patent bilaterally. PCA vessels demonstrate mild distal narrowing without a significant proximal stenosis or occlusion. There is no aneurysm. Venous sinuses: The dural sinuses are patent. Right transverse sinus is dominant. Straight sinus deep cerebral veins are intact. Cortical veins are within normal limits bilaterally. Anatomic variants: None Review of the MIP images confirms the above findings CT Brain Perfusion Findings: ASPECTS: 10/10 CBF (<30%) Volume: 0mL Perfusion (Tmax>6.0s) volume: 17mL Mismatch Volume: 17mL Infarction Location:Distal left ACA territory. IMPRESSION: 1. Focal area of ischemia involving the distal left ACA territory. 2. No significant large vessel occlusion associated with the left ACA. 3. Extensive distal small vessel disease throughout the circle-of-Willis. 4. No significant atherosclerotic disease in the neck. 5. Cervical spine fusion with degenerative endplate changes above and below the fused levels. Electronically Signed   By: Marin Roberts M.D.   On: 07/17/2019 18:44   Ct Code Stroke Cta Neck W/wo Contrast  Result Date: 07/17/2019 CLINICAL DATA:  Sudden onset of right upper extremity weakness. EXAM: CT ANGIOGRAPHY HEAD AND NECK CT PERFUSION BRAIN TECHNIQUE: Multidetector CT imaging of the head and neck was performed using the standard protocol during bolus administration of intravenous contrast. Multiplanar CT image reconstructions and MIPs were obtained to evaluate the vascular anatomy. Carotid stenosis measurements (when applicable) are obtained utilizing NASCET criteria, using the distal  internal carotid diameter as the denominator. Multiphase CT imaging of the brain was performed following IV bolus contrast injection. Subsequent parametric perfusion maps were calculated using RAPID software. CONTRAST:  90mL OMNIPAQUE IOHEXOL 350 MG/ML SOLN COMPARISON:  CT head without contrast of the same day. FINDINGS: CTA NECK FINDINGS Aortic arch: There is a common origin of the left common carotid artery and innominate artery. No significant vascular calcifications or stenosis are present at the aortic arch. Right carotid system: The right common carotid artery within normal limits. Right carotid bifurcation is unremarkable. Cervical right ICA is normal. Left carotid system: The left common carotid artery is within normal limits. Minimal atherosclerotic changes are present bifurcation. There is no significant stenosis. The cervical left ICA is normal. Vertebral arteries: The vertebral arteries originate from the subclavian arteries bilaterally. There is no significant stenosis at the origins. No focal stenosis or vascular injury is present in either vertebral artery in the neck. Skeleton: Cervical spine fusion is noted at C3-4, C4-5, and C5-6. Chronic endplate changes are present at C6-7 and C7-T1. Chronic endplate changes are present at C2-3. No focal lytic blastic lesions are present. Patient has 2 residual mandibular teeth, both with chronic  periapical lucencies. Other neck: No focal mucosal or submucosal lesions are present. The salivary glands are within normal limits. Thyroid is normal. No significant adenopathy is present. Upper chest: Lung apices are clear without focal nodule, mass, or airspace disease. Thoracic inlet is within normal limits. The upper mediastinum is unremarkable. Review of the MIP images confirms the above findings CTA HEAD FINDINGS Anterior circulation: Minimal atherosclerotic changes are present within the scratched at the internal carotid arteries are within normal limits from the high cervical segments through the ICA termini bilaterally. The A1 and M1 segments are normal. The left A1 segment is dominant. MCA bifurcations are within normal limits. The MCA branch vessels are normal bilaterally. No significant stenosis or occlusion is present. There is mild irregularity of distal ACA branch vessels without a significant focal stenosis or occlusion. Posterior circulation: The vertebral arteries are codominant. PICA origins are visualized and normal. Basilar artery is normal. Both posterior cerebral arteries originate from the basilar tip. Small posterior communicating arteries are patent bilaterally. PCA vessels demonstrate mild distal narrowing without a significant proximal stenosis or occlusion. There is no aneurysm. Venous sinuses: The dural sinuses are patent. Right transverse sinus is dominant. Straight sinus deep cerebral veins are intact. Cortical veins are within normal limits bilaterally. Anatomic variants: None Review of the MIP images confirms the above findings CT Brain Perfusion Findings: ASPECTS: 10/10 CBF (<30%) Volume: 0mL Perfusion (Tmax>6.0s) volume: 17mL Mismatch Volume: 17mL Infarction Location:Distal left ACA territory. IMPRESSION: 1. Focal area of ischemia involving the distal left ACA territory. 2. No significant large vessel occlusion associated with the left ACA. 3. Extensive distal small vessel disease  throughout the circle-of-Willis. 4. No significant atherosclerotic disease in the neck. 5. Cervical spine fusion with degenerative endplate changes above and below the fused levels. Electronically Signed   By: Marin Roberts M.D.   On: 07/17/2019 18:44   Ct Code Stroke Cta Cerebral Perfusion W/wo Contrast  Result Date: 07/17/2019 CLINICAL DATA:  Sudden onset of right upper extremity weakness. EXAM: CT ANGIOGRAPHY HEAD AND NECK CT PERFUSION BRAIN TECHNIQUE: Multidetector CT imaging of the head and neck was performed using the standard protocol during bolus administration of intravenous contrast. Multiplanar CT image reconstructions and MIPs were obtained to evaluate the vascular anatomy. Carotid stenosis measurements (when applicable) are obtained  utilizing NASCET criteria, using the distal internal carotid diameter as the denominator. Multiphase CT imaging of the brain was performed following IV bolus contrast injection. Subsequent parametric perfusion maps were calculated using RAPID software. CONTRAST:  90mL OMNIPAQUE IOHEXOL 350 MG/ML SOLN COMPARISON:  CT head without contrast of the same day. FINDINGS: CTA NECK FINDINGS Aortic arch: There is a common origin of the left common carotid artery and innominate artery. No significant vascular calcifications or stenosis are present at the aortic arch. Right carotid system: The right common carotid artery within normal limits. Right carotid bifurcation is unremarkable. Cervical right ICA is normal. Left carotid system: The left common carotid artery is within normal limits. Minimal atherosclerotic changes are present bifurcation. There is no significant stenosis. The cervical left ICA is normal. Vertebral arteries: The vertebral arteries originate from the subclavian arteries bilaterally. There is no significant stenosis at the origins. No focal stenosis or vascular injury is present in either vertebral artery in the neck. Skeleton: Cervical spine fusion is  noted at C3-4, C4-5, and C5-6. Chronic endplate changes are present at C6-7 and C7-T1. Chronic endplate changes are present at C2-3. No focal lytic blastic lesions are present. Patient has 2 residual mandibular teeth, both with chronic periapical lucencies. Other neck: No focal mucosal or submucosal lesions are present. The salivary glands are within normal limits. Thyroid is normal. No significant adenopathy is present. Upper chest: Lung apices are clear without focal nodule, mass, or airspace disease. Thoracic inlet is within normal limits. The upper mediastinum is unremarkable. Review of the MIP images confirms the above findings CTA HEAD FINDINGS Anterior circulation: Minimal atherosclerotic changes are present within the scratched at the internal carotid arteries are within normal limits from the high cervical segments through the ICA termini bilaterally. The A1 and M1 segments are normal. The left A1 segment is dominant. MCA bifurcations are within normal limits. The MCA branch vessels are normal bilaterally. No significant stenosis or occlusion is present. There is mild irregularity of distal ACA branch vessels without a significant focal stenosis or occlusion. Posterior circulation: The vertebral arteries are codominant. PICA origins are visualized and normal. Basilar artery is normal. Both posterior cerebral arteries originate from the basilar tip. Small posterior communicating arteries are patent bilaterally. PCA vessels demonstrate mild distal narrowing without a significant proximal stenosis or occlusion. There is no aneurysm. Venous sinuses: The dural sinuses are patent. Right transverse sinus is dominant. Straight sinus deep cerebral veins are intact. Cortical veins are within normal limits bilaterally. Anatomic variants: None Review of the MIP images confirms the above findings CT Brain Perfusion Findings: ASPECTS: 10/10 CBF (<30%) Volume: 0mL Perfusion (Tmax>6.0s) volume: 17mL Mismatch Volume: 17mL  Infarction Location:Distal left ACA territory. IMPRESSION: 1. Focal area of ischemia involving the distal left ACA territory. 2. No significant large vessel occlusion associated with the left ACA. 3. Extensive distal small vessel disease throughout the circle-of-Willis. 4. No significant atherosclerotic disease in the neck. 5. Cervical spine fusion with degenerative endplate changes above and below the fused levels. Electronically Signed   By: Marin Robertshristopher  Mattern M.D.   On: 07/17/2019 18:44   Ct Head Code Stroke Wo Contrast  Result Date: 07/17/2019 CLINICAL DATA:  Code stroke. Sudden onset of right upper extremity weakness. EXAM: CT HEAD WITHOUT CONTRAST TECHNIQUE: Contiguous axial images were obtained from the base of the skull through the vertex without intravenous contrast. COMPARISON:  None. FINDINGS: Brain: No acute infarct, hemorrhage, or mass lesion is present. No significant white matter lesions are present.  The ventricles are of normal size. No significant extraaxial fluid collection is present. The brainstem and cerebellum are within normal limits. Vascular: There is a possible hyperdense left MCA near the bifurcation. No significant vascular calcifications are present. Skull: Calvarium is intact. No focal lytic or blastic lesions are present. Sinuses/Orbits: The paranasal sinuses and mastoid air cells are clear. The globes and orbits are within normal limits. ASPECTS [facility] Stroke Program Early CT Score) - Ganglionic level infarction (caudate, lentiform nuclei, internal capsule, insula, M1-M3 cortex): 7/7 - Supraganglionic infarction (M4-M6 cortex): 3/3 Total score (0-10 with 10 being normal): 10/10 IMPRESSION: 1. Possible hyperdense left MCA near the bifurcation. 2. No focal cortical abnormalities. 3. ASPECTS is 10/10 The above was relayed via text pager to Dr. Laurence Slate on 07/17/2019 at 18:07 . Electronically Signed   By: Marin Roberts M.D.   On: 07/17/2019 18:07    Procedures .Critical  Care Performed by: Pricilla Loveless, MD Authorized by: Pricilla Loveless, MD   Critical care provider statement:    Critical care time (minutes):  35   Critical care time was exclusive of:  Separately billable procedures and treating other patients   Critical care was necessary to treat or prevent imminent or life-threatening deterioration of the following conditions:  CNS failure or compromise   Critical care was time spent personally by me on the following activities:  Discussions with consultants, evaluation of patient's response to treatment, examination of patient, ordering and performing treatments and interventions, ordering and review of laboratory studies, ordering and review of radiographic studies, pulse oximetry, re-evaluation of patient's condition, obtaining history from patient or surrogate and review of old charts   (including critical care time)  Angiocath insertion Performed by: Audree Camel  Consent: Verbal consent obtained. Risks and benefits: risks, benefits and alternatives were discussed Time out: Immediately prior to procedure a "time out" was called to verify the correct patient, procedure, equipment, support staff and site/side marked as required.  Preparation: Patient was prepped and draped in the usual sterile fashion.  Vein Location: right basilic  Ultrasound Guided  Gauge: 18  Normal blood return and flush without difficulty Patient tolerance: Patient tolerated the procedure well with no immediate complications.    Angiocath insertion Performed by: Audree Camel  Consent: Verbal consent obtained. Risks and benefits: risks, benefits and alternatives were discussed Time out: Immediately prior to procedure a "time out" was called to verify the correct patient, procedure, equipment, support staff and site/side marked as required.  Preparation: Patient was prepped and draped in the usual sterile fashion.  Vein Location: left cephalic  Ultrasound  Guided  Gauge: 20  Normal blood return and flush without difficulty Patient tolerance: Patient tolerated the procedure well with no immediate complications.    Medications Ordered in ED Medications  sodium chloride flush (NS) 0.9 % injection 3 mL (has no administration in time range)  0.9 %  sodium chloride infusion ( Intravenous New Bag/Given 07/17/19 1931)  acetaminophen (TYLENOL) tablet 650 mg (has no administration in time range)    Or  acetaminophen (TYLENOL) solution 650 mg (has no administration in time range)    Or  acetaminophen (TYLENOL) suppository 650 mg (has no administration in time range)  pantoprazole (PROTONIX) injection 40 mg (40 mg Intravenous Given 07/17/19 2151)  labetalol (NORMODYNE) injection 10 mg (has no administration in time range)  iohexol (OMNIPAQUE) 350 MG/ML injection 90 mL (90 mLs Intravenous Contrast Given 07/17/19 1813)  alteplase (ACTIVASE) 1 mg/mL infusion 90 mg (0 mg Intravenous  Stopped 07/17/19 1920)    Followed by  0.9 %  sodium chloride infusion (0 mLs Intravenous Stopped 07/17/19 1930)   stroke: mapping our early stages of recovery book ( Does not apply Given 07/17/19 2336)     Initial Impression / Assessment and Plan / ED Course  I have reviewed the triage vital signs and the nursing notes.  Pertinent labs & imaging results that were available during my care of the patient were reviewed by me and considered in my medical decision making (see chart for details).        Patient is within the TPA window.  Neurology has decided to start TPA.  CT angiography/perfusion study without obvious large vessel occlusion.  Patient will be admitted to the neuro ICU.  Protecting his airway.  Final Clinical Impressions(s) / ED Diagnoses   Final diagnoses:  Ischemic stroke [facility](HCC)    ED Discharge Orders    None       Pricilla LovelessGoldston, Brettany Sydney, MD 07/17/19 2349

## 2019-07-17 NOTE — Progress Notes (Signed)
Pharmacist Code Stroke Response  Notified to mix tPA at 1757 by Dr. Lorraine Lax Delivered tPA to RN at 1801  tPA dose = 9mg  bolus over 1 minute followed by 81mg  for a total dose of 90mg  over 1 hour  Issues/delays encountered (if applicable): Needed to establish IV access and obtain additional imaging  Ricky Walter, Ricky Walter 07/17/19 6:11 PM

## 2019-07-18 ENCOUNTER — Inpatient Hospital Stay (HOSPITAL_COMMUNITY): Payer: Medicare HMO

## 2019-07-18 ENCOUNTER — Encounter (HOSPITAL_COMMUNITY): Payer: Self-pay | Admitting: *Deleted

## 2019-07-18 DIAGNOSIS — I639 Cerebral infarction, unspecified: Secondary | ICD-10-CM

## 2019-07-18 DIAGNOSIS — E669 Obesity, unspecified: Secondary | ICD-10-CM

## 2019-07-18 DIAGNOSIS — I10 Essential (primary) hypertension: Secondary | ICD-10-CM

## 2019-07-18 DIAGNOSIS — I63422 Cerebral infarction due to embolism of left anterior cerebral artery: Principal | ICD-10-CM

## 2019-07-18 DIAGNOSIS — E785 Hyperlipidemia, unspecified: Secondary | ICD-10-CM

## 2019-07-18 LAB — LIPID PANEL
Cholesterol: 184 mg/dL (ref 0–200)
HDL: 46 mg/dL (ref >40)
LDL Cholesterol: 121 mg/dL — ABNORMAL HIGH (ref 0–99)
Total CHOL/HDL Ratio: 4 RATIO
Triglycerides: 87 mg/dL (ref <150)
VLDL: 17 mg/dL (ref 0–40)

## 2019-07-18 LAB — CBC
HCT: 36.9 % — ABNORMAL LOW (ref 39.0–52.0)
Hemoglobin: 11.9 g/dL — ABNORMAL LOW (ref 13.0–17.0)
MCH: 28.6 pg (ref 26.0–34.0)
MCHC: 32.2 g/dL (ref 30.0–36.0)
MCV: 88.7 fL (ref 80.0–100.0)
Platelets: 170 10*3/uL (ref 150–400)
RBC: 4.16 MIL/uL — ABNORMAL LOW (ref 4.22–5.81)
RDW: 14.2 % (ref 11.5–15.5)
WBC: 5.5 10*3/uL (ref 4.0–10.5)
nRBC: 0 % (ref 0.0–0.2)

## 2019-07-18 LAB — BASIC METABOLIC PANEL
Anion gap: 10 (ref 5–15)
BUN: 10 mg/dL (ref 8–23)
CO2: 24 mmol/L (ref 22–32)
Calcium: 8.8 mg/dL — ABNORMAL LOW (ref 8.9–10.3)
Chloride: 102 mmol/L (ref 98–111)
Creatinine, Ser: 1.02 mg/dL (ref 0.61–1.24)
GFR calc Af Amer: >60 mL/min (ref >60)
GFR calc non Af Amer: >60 mL/min (ref >60)
Glucose, Bld: 85 mg/dL (ref 70–99)
Potassium: 3.8 mmol/L (ref 3.5–5.1)
Sodium: 136 mmol/L (ref 135–145)

## 2019-07-18 LAB — ECHOCARDIOGRAM COMPLETE
Height: 71 in
Weight: 4172.87 oz

## 2019-07-18 LAB — HEMOGLOBIN A1C
Hgb A1c MFr Bld: 5.8 % — ABNORMAL HIGH (ref 4.8–5.6)
Mean Plasma Glucose: 119.76 mg/dL

## 2019-07-18 LAB — GLUCOSE, CAPILLARY: Glucose-Capillary: 84 mg/dL (ref 70–99)

## 2019-07-18 LAB — HIV ANTIBODY (ROUTINE TESTING W REFLEX): HIV Screen 4th Generation wRfx: NONREACTIVE

## 2019-07-18 LAB — MRSA PCR SCREENING: MRSA by PCR: NEGATIVE

## 2019-07-18 MED ORDER — ATORVASTATIN CALCIUM 40 MG PO TABS
40.0000 mg | ORAL_TABLET | Freq: Every day | ORAL | Status: DC
  Administered 2019-07-18 – 2019-07-20 (×3): 40 mg via ORAL
  Filled 2019-07-18 (×3): qty 1

## 2019-07-18 MED ORDER — PANTOPRAZOLE SODIUM 40 MG PO TBEC
40.0000 mg | DELAYED_RELEASE_TABLET | Freq: Every day | ORAL | Status: DC
  Administered 2019-07-18 – 2019-07-20 (×3): 40 mg via ORAL
  Filled 2019-07-18 (×3): qty 1

## 2019-07-18 MED ORDER — CHLORHEXIDINE GLUCONATE CLOTH 2 % EX PADS: 6.0000 | MEDICATED_PAD | Freq: Every day | CUTANEOUS | Status: DC

## 2019-07-18 MED ORDER — CLOPIDOGREL BISULFATE 75 MG PO TABS
75.0000 mg | ORAL_TABLET | Freq: Every day | ORAL | Status: DC
  Administered 2019-07-18 – 2019-07-20 (×3): 75 mg via ORAL
  Filled 2019-07-18 (×3): qty 1

## 2019-07-18 MED ORDER — ASPIRIN EC 81 MG PO TBEC
81.0000 mg | DELAYED_RELEASE_TABLET | Freq: Every day | ORAL | Status: DC
  Administered 2019-07-18 – 2019-07-20 (×3): 81 mg via ORAL
  Filled 2019-07-18 (×3): qty 1

## 2019-07-18 MED ORDER — PERFLUTREN LIPID MICROSPHERE
1.0000 mL | INTRAVENOUS | Status: DC | PRN
  Administered 2019-07-18: 3 mL via INTRAVENOUS
  Filled 2019-07-18: qty 10

## 2019-07-18 MED ORDER — METHADONE HCL 10 MG PO TABS
60.0000 mg | ORAL_TABLET | Freq: Every day | ORAL | Status: DC
  Administered 2019-07-18 – 2019-07-20 (×3): 60 mg via ORAL
  Filled 2019-07-18 (×3): qty 6

## 2019-07-18 NOTE — Progress Notes (Signed)
PT Cancellation Note  Patient Details Name: CAMRAN KEADY MRN: [id number] DOB: October 24, 1956   Cancelled Treatment:    Reason Eval/Treat Not Completed: Active bedrest order   Strict bedrest until order is discontinued;   Will follow up for appropriateness of PT eval tomorrow;   Roney Marion, [place] Pager [phone] Office [phone]    [place] 07/18/2019, 7:44 AM

## 2019-07-18 NOTE — Progress Notes (Signed)
STROKE TEAM PROGRESS NOTE   SUBJECTIVE (INTERVAL HISTORY) His RN is at the bedside.  Pt sitting in bed for lunch. He said his right leg weakness much improved and near baseline now. Pending MRI. Passed swallow and will d/c IVF. He denies heart palpitation.    OBJECTIVE Vitals:   07/18/19 0800 07/18/19 0900 07/18/19 1000 07/18/19 1100  BP: 138/83 121/88 (!) 146/91 124/79  Pulse: 76 67 73 69  Resp: 17 12 15 14   Temp:      TempSrc:      SpO2: 99% 98% 98% 94%  Weight:      Height:        CBC:  Recent Labs  Lab 07/17/19 1743 07/17/19 1747 07/18/19 0342  WBC 7.9  --  5.5  NEUTROABS 5.7  --   --   HGB 12.8* 13.6 11.9*  HCT 40.7 40.0 36.9*  MCV 90.8  --  88.7  PLT 183  --  010    Basic Metabolic Panel:  Recent Labs  Lab 07/17/19 1743 07/17/19 1747 07/18/19 0342  NA 137 138 136  K 4.0 4.0 3.8  CL 104 104 102  CO2 23  --  24  GLUCOSE 104* 101* 85  BUN 12 12 10   CREATININE 1.17 1.10 1.02  CALCIUM 9.2  --  8.8*    Lipid Panel:     Component Value Date/Time   CHOL 184 07/18/2019 0342   CHOL 168 08/06/2017 1244   TRIG 87 07/18/2019 0342   HDL 46 07/18/2019 0342   HDL 54 08/06/2017 1244   CHOLHDL 4.0 07/18/2019 0342   VLDL 17 07/18/2019 0342   LDLCALC 121 (H) 07/18/2019 0342   LDLCALC 95 08/06/2017 1244   HgbA1c:  Lab Results  Component Value Date   HGBA1C 5.8 (H) 07/18/2019   Urine Drug Screen:     Component Value Date/Time   LABOPIA NONE DETECTED 07/17/2019 1934   COCAINSCRNUR NONE DETECTED 07/17/2019 1934   LABBENZ NONE DETECTED 07/17/2019 1934   AMPHETMU NONE DETECTED 07/17/2019 1934   THCU NONE DETECTED 07/17/2019 1934   LABBARB NONE DETECTED 07/17/2019 1934    Alcohol Level No results found for: ETH  IMAGING  Ct Code Stroke Cta Head W/wo Contrast Ct Code Stroke Cta Neck W/wo Contrast Ct Code Stroke Cta Cerebral Perfusion W/wo Contrast 07/17/2019 IMPRESSION:  1. Focal area of ischemia involving the distal left ACA territory.  2. No  significant large vessel occlusion associated with the left ACA.  3. Extensive distal small vessel disease throughout the circle[place].  4. No significant atherosclerotic disease in the neck.  5. Cervical spine fusion with degenerative endplate changes above and below the fused levels.    Ct Head Code Stroke Wo Contrast 07/17/2019 IMPRESSION:  1. Possible hyperdense left MCA near the bifurcation.  2. No focal cortical abnormalities.  3. ASPECTS is 10/10   Vas [place] Lower Extremity Venous (dvt) 07/18/2019 Summary:  Right: There is no evidence of deep vein thrombosis in the lower extremity.  Left: There is no evidence of deep vein thrombosis in the lower extremity.     Transthoracic Echocardiogram   1. The left ventricle has a visually estimated ejection fraction of approximately 50%. The cavity size was normal. There is mildly increased left ventricular wall thickness. Left ventricular diastolic Doppler parameters are consistent with impaired  relaxation.  2. The right ventricle has normal systolic function. The cavity was normal. There is no increase in right ventricular wall thickness. Right ventricular systolic pressure could  not be assessed.  3. The aortic valve is tricuspid. Mild aortic annular calcification noted.  4. The mitral valve is grossly normal. Mild calcification of the mitral valve leaflet. There is mild mitral annular calcification present.  5. The tricuspid valve is grossly normal.  6. The aorta is normal in size and structure.   ECG - SR rate    83 BPM. (See cardiology reading for complete details)    PHYSICAL EXAM  Temp:  [98.2 F (36.8 C)-99.2 F (37.3 C)] 98.4 F (36.9 C) (07/26 1236) Pulse Rate:  [61-85] 72 (07/26 1600) Resp:  [8-26] 17 (07/26 1600) BP: (113-164)/(73-112) 141/85 (07/26 1600) SpO2:  [93 %-100 %] 98 % (07/26 1600) Weight:  [118.3 kg-122.7 kg] 118.3 kg (07/25 2000)  General - Well nourished, well developed, in no apparent  distress.  Ophthalmologic - fundi not visualized due to noncooperation.  Cardiovascular - Regular rate and rhythm.  Mental Status -  Level of arousal and orientation to time, place, and person were intact. Language including expression, naming, repetition, comprehension was assessed and found intact. Fund of Knowledge was assessed and was intact.  Cranial Nerves II - XII - II - Visual field intact OU. III, IV, VI - Extraocular movements intact. V - Facial sensation intact bilaterally. VII - Facial movement intact bilaterally. VIII - Hearing & vestibular intact bilaterally. X - Palate elevates symmetrically. XI - Chin turning & shoulder shrug intact bilaterally. XII - Tongue protrusion intact.  Motor Strength - The patient's strength was normal in all extremities and pronator drift was absent.  Bulk was normal and fasciculations were absent.   Motor Tone - Muscle tone was assessed at the neck and appendages and was normal.  Reflexes - The patient's reflexes were symmetrical in all extremities and he had no pathological reflexes.  Sensory - Light touch, temperature/pinprick were assessed and were symmetrical.    Coordination - The patient had normal movements in the hands and feet with no ataxia or dysmetria.  Tremor was absent.  Gait and Station - deferred.   ASSESSMENT/PLAN [name] is a 63 y.o. male with history of HTN, HLD, obesity, anxiety, arthritis with chronic back pain and thyroid dz who presented to ER with sudden onset of right leg weakness which caused a fall. On initial evaluation pt had no movement in the right leg, mild neglect and sensory loss in left leg more than arm. He receive IV t-PA Saturday 07/17/19 at 1800.  Stroke:  Left ACA infarct s/p tPA - embolic pattern - unclear source  CT head - no acute abnormality   CTA H&N - Focal area of ischemia involving the distal left ACA territory. Left ACA patent.  MRI head - pending  LE Venous Dopplers -  negative for DVT  2D Echo EF 50%  Recommend TEE and loop recorder for cardioembolic work up  [employer] Corporation[employer] Corona Virus 2 - negative  LDL - 121  HgbA1c - 5.8  UDS - negative  VTE prophylaxis - SCDs  Diet - 2 gram Na  No antithrombotic prior to admission, now on No antithrombotic within 24h of tPA  Patient will be counseled to be compliant with his antithrombotic medications  Ongoing aggressive stroke risk factor management  Therapy recommendations:  pending  Disposition:  Pending  Hypertension  Stable . Permissive hypertension (OK if < 180/105) but gradually normalize in 3-5 days . Long-term BP goal normotensive  Hyperlipidemia  Lipid lowering medication PTA:  none  LDL 121, goal < 70  Current lipid lowering medication: on Lipitor 40 mg daily   Continue statin at discharge  Other Stroke Risk Factors  Advanced age  Obesity, Body mass index is 36.37 kg/m., recommend weight loss, diet and exercise as appropriate   Family hx stroke (father)  Other Active Problems  Chronic hepatitis C  Methadone daily therapy for chronic LBP  Hospital day # 1  This patient is critically ill due to stroke s/p tPA and at significant risk of neurological worsening, death form recurrent stroke, hemorrhagic conversion, seizure. This patient's care requires constant monitoring of vital signs, hemodynamics, respiratory and cardiac monitoring, review of multiple databases, neurological assessment, discussion with family, other specialists and medical decision making of high complexity. I spent 35 minutes of neurocritical care time in the care of this patient.  Marvel PlanJindong Shaneil Yazdi, MD PhD Stroke Neurology 07/18/2019 4:54 PM  To contact Stroke Continuity provider, please refer to [email or web address]. After hours, contact General Neurology

## 2019-07-18 NOTE — Progress Notes (Signed)
  Echocardiogram 2D Echocardiogram has been performed.  Marybelle Killings 07/18/2019, 4:02 PM

## 2019-07-18 NOTE — Progress Notes (Signed)
VASCULAR LAB PRELIMINARY  PRELIMINARY  PRELIMINARY  PRELIMINARY  Bilateral lower extremity venous duplex completed.    Preliminary report:  See CV proc for preliminary results.   Jonea Bukowski, RVT 07/18/2019, 12:01 PM

## 2019-07-18 NOTE — Plan of Care (Signed)
  Problem: Education: Goal: Knowledge of disease or condition will improve Outcome: Progressing   Problem: Self-Care: Goal: Ability to participate in self-care as condition permits will improve Outcome: Progressing Goal: Verbalization of feelings and concerns over difficulty with self-care will improve Outcome: Progressing Goal: Ability to communicate needs accurately will improve Outcome: Progressing   Problem: Nutrition: Goal: Risk of aspiration will decrease Outcome: Progressing Goal: Dietary intake will improve Outcome: Progressing   Problem: Activity: Goal: Risk for activity intolerance will decrease Outcome: Progressing   Problem: Coping: Goal: Level of anxiety will decrease Outcome: Progressing   Problem: Safety: Goal: Ability to remain free from injury will improve Outcome: Progressing   Problem: Skin Integrity: Goal: Risk for impaired skin integrity will decrease Outcome: Progressing

## 2019-07-19 DIAGNOSIS — E785 Hyperlipidemia, unspecified: Secondary | ICD-10-CM | POA: Diagnosis present

## 2019-07-19 DIAGNOSIS — Z823 Family history of stroke: Secondary | ICD-10-CM

## 2019-07-19 DIAGNOSIS — R54 Age-related physical debility: Secondary | ICD-10-CM | POA: Diagnosis present

## 2019-07-19 DIAGNOSIS — I10 Essential (primary) hypertension: Secondary | ICD-10-CM | POA: Diagnosis present

## 2019-07-19 DIAGNOSIS — E039 Hypothyroidism, unspecified: Secondary | ICD-10-CM

## 2019-07-19 DIAGNOSIS — I6340 Cerebral infarction due to embolism of unspecified cerebral artery: Secondary | ICD-10-CM

## 2019-07-19 DIAGNOSIS — E669 Obesity, unspecified: Secondary | ICD-10-CM | POA: Diagnosis present

## 2019-07-19 HISTORY — DX: Family history of stroke: Z82.3

## 2019-07-19 LAB — TSH: TSH: 23.011 u[IU]/mL — ABNORMAL HIGH (ref 0.350–4.500)

## 2019-07-19 LAB — BASIC METABOLIC PANEL
Anion gap: 7 (ref 5–15)
BUN: 13 mg/dL (ref 8–23)
CO2: 26 mmol/L (ref 22–32)
Calcium: 8.6 mg/dL — ABNORMAL LOW (ref 8.9–10.3)
Chloride: 103 mmol/L (ref 98–111)
Creatinine, Ser: 1.16 mg/dL (ref 0.61–1.24)
GFR calc Af Amer: >60 mL/min (ref >60)
GFR calc non Af Amer: >60 mL/min (ref >60)
Glucose, Bld: 105 mg/dL — ABNORMAL HIGH (ref 70–99)
Potassium: 3.9 mmol/L (ref 3.5–5.1)
Sodium: 136 mmol/L (ref 135–145)

## 2019-07-19 LAB — T4, FREE: Free T4: 0.71 ng/dL (ref 0.61–1.12)

## 2019-07-19 LAB — CBC
HCT: 36.4 % — ABNORMAL LOW (ref 39.0–52.0)
Hemoglobin: 11.7 g/dL — ABNORMAL LOW (ref 13.0–17.0)
MCH: 28.6 pg (ref 26.0–34.0)
MCHC: 32.1 g/dL (ref 30.0–36.0)
MCV: 89 fL (ref 80.0–100.0)
Platelets: 177 10*3/uL (ref 150–400)
RBC: 4.09 MIL/uL — ABNORMAL LOW (ref 4.22–5.81)
RDW: 14.2 % (ref 11.5–15.5)
WBC: 5.1 10*3/uL (ref 4.0–10.5)
nRBC: 0 % (ref 0.0–0.2)

## 2019-07-19 MED ORDER — LEVOTHYROXINE SODIUM 100 MCG PO TABS
200.0000 ug | ORAL_TABLET | Freq: Every day | ORAL | Status: DC
  Administered 2019-07-19 – 2019-07-20 (×2): 200 ug via ORAL
  Filled 2019-07-19 (×2): qty 2

## 2019-07-19 MED ORDER — LEVOTHYROXINE SODIUM 75 MCG PO TABS: 150.0000 ug | ORAL_TABLET | Freq: Every day | ORAL | Status: DC

## 2019-07-19 MED ORDER — POTASSIUM CHLORIDE CRYS ER 20 MEQ PO TBCR
40.0000 meq | EXTENDED_RELEASE_TABLET | Freq: Two times a day (BID) | ORAL | Status: AC
  Administered 2019-07-19 (×2): 40 meq via ORAL
  Filled 2019-07-19 (×2): qty 2

## 2019-07-19 NOTE — Evaluation (Signed)
Occupational Therapy Evaluation Patient Details Name: Ricky Walter MRN: [id number] DOB: 09/01/1956 Today's Date: 07/19/2019    History of Present Illness 63 y.o. male admitted on 07/17/19 for R leg weakness s/p tPA.  L frontal, parieta, and temporal lobe patchy acute infarcts on MRI.  Pt with significant PMH of HTN, chronic back pain, and anterior cervical fusion.   Clinical Impression   Patient presenting with decreased I in self care, balance, functional mobility/transfers, endurance, and safety awareness, and strength.  Patient reports being mod I with use of SPC PTA. He also reports feeling like he is close to baseline with mobility. Patient currently functioning at min guard - min A. Patient will benefit from acute OT to increase overall independence in the areas of ADLs, functional mobility, and safety awareness in order to safely discharge to next venue of care.    Follow Up Recommendations  Home health OT;Supervision/Assistance - 24 hour    Equipment Recommendations  None recommended by OT       Precautions / Restrictions Precautions Precautions: Fall Precaution Comments: uses a cane at baseline Restrictions Weight Bearing Restrictions: No      Mobility Bed Mobility Overal bed mobility: Modified Independent      General bed mobility comments: HOB elevated  Transfers Overall transfer level: Needs assistance Equipment used: None Transfers: Sit to/from Stand Sit to Stand: Min guard         General transfer comment: supervision for safety    Balance Overall balance assessment: Needs assistance Sitting-balance support: Feet supported;No upper extremity supported Sitting balance-Leahy Scale: Good     Standing balance support: Single extremity supported Standing balance-Leahy Scale: Fair        ADL either performed or assessed with clinical judgement   ADL Overall ADL's : Needs assistance/impaired Eating/Feeding: Modified independent   Grooming: Wash/dry  hands;Wash/dry face;Oral care;Standing;Min guard   Upper Body Bathing: Set up;Sitting   Lower Body Bathing: Minimal assistance;Sit to/from stand   Upper Body Dressing : Set up;Sitting   Lower Body Dressing: Minimal assistance;Sit to/from stand   Toilet Transfer: Min guard;Ambulation           Functional mobility during ADLs: Min guard;Cueing for safety General ADL Comments: min guard hand held assistance for balance with functional mobility and transfers     Vision Baseline Vision/History: No visual deficits Patient Visual Report: No change from baseline              Pertinent Vitals/Pain Pain Assessment: No/denies pain     Hand Dominance Right   Extremity/Trunk Assessment Upper Extremity Assessment Upper Extremity Assessment: Overall WFL for tasks assessed   Lower Extremity Assessment Lower Extremity Assessment: Defer to PT evaluation LLE Deficits / Details: left leg is acutally weaker than right leg.  Pt reports that L leg weakness at the thigh is normal for him.  He had difficulty lifting his leg against gravity on this side 3-/5.  Sensation intact to LT bil.  Strength in R leg is 4/5 throughout.  LLE Sensation: WNL   Cervical / Trunk Assessment Cervical / Trunk Assessment: Other exceptions Cervical / Trunk Exceptions: h/o cervical fusion and chronic low back pain (possible source of L leg weakness?)   Communication Communication Communication: No difficulties   Cognition Arousal/Alertness: Awake/alert Behavior During Therapy: WFL for tasks assessed/performed Overall Cognitive Status: History of cognitive impairments - at baseline      General Comments: Not specifically tested, but conversation normal and command following and processing time seemed grossly intact  Home Living Family/patient expects to be discharged to:: Private residence Living Arrangements: Spouse/significant other Available Help at Discharge: Family;Available 24  hours/day Type of Home: House Home Access: Stairs to enter [employer] of Steps: 3 Entrance Stairs-Rails: None Home Layout: One level     Bathroom Shower/Tub: Occupational psychologist: Standard Bathroom Accessibility: Yes How Accessible: Other (comment)(SPC) Home Equipment: Cane - single point      Lives With: Significant other    Prior Functioning/Environment Level of Independence: Independent with assistive device(s)        Comments: uses a cane for community access, not inside the home, independent with ADLs and likes to garden        OT Problem List: Decreased strength;Decreased coordination;Decreased activity tolerance;Decreased safety awareness;Impaired balance (sitting and/or standing)      OT Treatment/Interventions: Self-care/ADL training;Therapeutic exercise;Therapeutic activities;Neuromuscular education;Energy conservation;DME and/or AE instruction;Patient/family education;Manual therapy    OT Goals(Current goals can be found in the care plan section) Acute Rehab OT Goals Patient Stated Goal: to get home soon, get back to his garden OT Goal Formulation: With patient Time For Goal Achievement: 08/02/19 Potential to Achieve Goals: Good ADL Goals Pt Will Perform Upper Body Bathing: with supervision Pt Will Perform Lower Body Bathing: with supervision Pt Will Perform Upper Body Dressing: with supervision Pt Will Perform Lower Body Dressing: with supervision Pt Will Transfer to Toilet: with supervision Pt Will Perform Toileting - Clothing Manipulation and hygiene: with supervision  OT Frequency: Min 2X/week   Barriers to D/C: Other (comment)(none known at this time)             AM-PAC OT "6 Clicks" Daily Activity     Outcome Measure Help from another person eating meals?: None Help from another person taking care of personal grooming?: A Little Help from another person toileting, which includes using toliet, bedpan, or urinal?: A  Little Help from another person bathing (including washing, rinsing, drying)?: A Little Help from another person to put on and taking off regular upper body clothing?: A Little Help from another person to put on and taking off regular lower body clothing?: A Little 6 Click Score: 19   End of Session Nurse Communication: Mobility status  Activity Tolerance: Patient tolerated treatment well Patient left: in bed;with call bell/phone within reach;with bed alarm set  OT Visit Diagnosis: Hemiplegia and hemiparesis;Unsteadiness on feet (R26.81);Muscle weakness (generalized) (M62.81) Hemiplegia - Right/Left: Left Hemiplegia - dominant/non-dominant: Non-Dominant                Time: 3662-9476 OT Time Calculation (min): 23 min Charges:  OT General Charges $OT Visit: 1 Visit OT Evaluation $OT Eval Low Complexity: 1 Low OT Treatments $Self Care/Home Management : 8-22 mins   Gypsy Decant 07/19/2019, 11:55 AM

## 2019-07-19 NOTE — Evaluation (Addendum)
Physical Therapy Evaluation Patient Details Name: Ricky NormanJohn D Simerson MRN: [id number] DOB: [birth year] Today's Date: 07/19/2019   History of Present Illness  63 y.o. male admitted on 07/17/19 for R leg weakness s/p tPA.  L frontal, parieta, and temporal lobe patchy acute infarcts on MRI.  Pt with significant PMH of HTN, chronic back pain, and anterior cervical fusion.  Clinical Impression  Pt reports he is near his baseline (80% back to normal).  He is mildly unsteady on his feet and actually weaker in his left leg now compared to his right initially symptomatic leg.  He reports left leg weakness is baseline and may be related possibly to his h/o back pain.   PT to follow acutely for deficits listed below. Reviewed "BEFAST" with pt.     Follow Up Recommendations Home health PT    Equipment Recommendations  None recommended by PT    Recommendations for Other Services   NA    Precautions / Restrictions Precautions Precautions: Fall Precaution Comments: uses a cane at baselin      Mobility  Bed Mobility Overal bed mobility: Modified Independent             General bed mobility comments: HOB elevated  Transfers Overall transfer level: Needs assistance Equipment used: Straight cane Transfers: Sit to/from Stand Sit to Stand: Supervision         General transfer comment: supervision for safety  Ambulation/Gait Ambulation/Gait assistance: Min guard Gait Distance (Feet): 250 Feet Assistive device: Straight cane Gait Pattern/deviations: Step-through pattern(trunk anterior of BOS) Gait velocity: decreased Gait velocity interpretation: 1.31 - 2.62 ft/sec, indicative of limited community ambulator General Gait Details: pt tends to thrust anteriorly with his trunk forward of his BOS.  He reports he uses a cane for community gait at baseline.  He did look better with cane use on the way back from the rehab gym.   Stairs Stairs: Yes Stairs assistance: Supervision Stair Management:  Two rails;Alternating pattern;Forwards Number of Stairs: 2 General stair comments: supervision for safety.  Pt did report that he braces his trunk against the wall for support at home since he does not have rails.       Modified Rankin (Stroke Patients Only) Modified Rankin (Stroke Patients Only) Pre-Morbid Rankin Score: No significant disability Modified Rankin: Moderately severe disability     Balance Overall balance assessment: Needs assistance Sitting-balance support: Feet supported;No upper extremity supported Sitting balance-Leahy Scale: Good     Standing balance support: Single extremity supported Standing balance-Leahy Scale: Fair                               Pertinent Vitals/Pain Pain Assessment: No/denies pain    Home Living Family/patient expects to be discharged to:: Private residence Living Arrangements: Spouse/significant other Available Help at Discharge: Family Type of Home: House Home Access: Stairs to enter Entrance Stairs-Rails: None Secretary/administratorntrance Stairs-Number of Steps: 3 Home Layout: One level Home Equipment: Cane - single point      Prior Function Level of Independence: Independent with assistive device(s)         Comments: uses a cane for community access, not inside the home, independent with ADLs and likes to garden     Hand Dominance   Dominant Hand: Right    Extremity/Trunk Assessment   Upper Extremity Assessment Upper Extremity Assessment: Defer to OT evaluation    Lower Extremity Assessment Lower Extremity Assessment: LLE deficits/detail LLE Deficits / Details: left leg  is acutally weaker than right leg.  Pt reports that L leg weakness at the thigh is normal for him.  He had difficulty lifting his leg against gravity on this side 3-/5.  Sensation intact to LT bil.  Strength in R leg is 4/5 throughout.  LLE Sensation: WNL    Cervical / Trunk Assessment Cervical / Trunk Assessment: Other exceptions Cervical / Trunk  Exceptions: h/o cervical fusion and chronic low back pain (possible source of L leg weakness?)  Communication   Communication: No difficulties  Cognition Arousal/Alertness: Awake/alert Behavior During Therapy: WFL for tasks assessed/performed Overall Cognitive Status: Within Functional Limits for tasks assessed                                 General Comments: Not specifically tested, but conversation normal and command following and processing time seemed grossly intact             Assessment/Plan    PT Assessment Patient needs continued PT services  PT Problem List Decreased strength;Decreased activity tolerance;Decreased balance;Decreased mobility;Decreased knowledge of use of DME;Decreased knowledge of precautions;Obesity       PT Treatment Interventions DME instruction;Gait training;Stair training;Functional mobility training;Therapeutic activities;Therapeutic exercise;Balance training;Patient/family education;Neuromuscular re-education    PT Goals (Current goals can be found in the Care Plan section)  Acute Rehab PT Goals Patient Stated Goal: to get home soon, get back to his garden PT Goal Formulation: With patient Time For Goal Achievement: 08/02/19 Potential to Achieve Goals: Good    Frequency Min 4X/week           AM-PAC PT "6 Clicks" Mobility  Outcome Measure Help needed turning from your back to your side while in a flat bed without using bedrails?: None Help needed moving from lying on your back to sitting on the side of a flat bed without using bedrails?: None Help needed moving to and from a bed to a chair (including a wheelchair)?: None Help needed standing up from a chair using your arms (e.g., wheelchair or bedside chair)?: None Help needed to walk in hospital room?: A Little Help needed climbing 3-5 steps with a railing? : A Little 6 Click Score: 22    End of Session Equipment Utilized During Treatment: Gait belt Activity Tolerance:  Patient tolerated treatment well Patient left: in bed;with call bell/phone within reach;with bed alarm set   PT Visit Diagnosis: Muscle weakness (generalized) (M62.81);Difficulty in walking, not elsewhere classified (R26.2);Other symptoms and signs involving the nervous system (A12.878)    Time: 1023-1040 PT Time Calculation (min) (ACUTE ONLY): 17 min   Charges:      Wells Guiles B. Myeesha Shane, PT, DPT  Acute Rehabilitation [phone] pager [phone]) [phone] office  @ [facility]: [phone]    PT Evaluation $PT Eval Moderate Complexity: 1 Mod         07/19/2019, 11:04 AM

## 2019-07-19 NOTE — Progress Notes (Signed)
STROKE TEAM PROGRESS NOTE   SUBJECTIVE (INTERVAL HISTORY) Pt lying in bed, talking to his wife over the facetime. No complains, neuro at baseline. Pt TSH was high but FT4 was normal. Will increase synthroid dose.   OBJECTIVE Vitals:   07/18/19 2257 07/19/19 0343 07/19/19 0739 07/19/19 1142  BP: (!) 122/92 102/69 102/67 116/77  Pulse: 69 65 70 66  Resp: 18 18 15 15   Temp: 98.5 F (36.9 C) 98 F (36.7 C) 98.2 F (36.8 C) 98 F (36.7 C)  TempSrc: Oral Oral Oral Oral  SpO2: 99% 94% 95% (!) 89%  Weight: 120.9 kg     Height: 5\' 11"  (1.803 m)       CBC:  Recent Labs  Lab 07/17/19 1743  07/18/19 0342 07/19/19 0544  WBC 7.9  --  5.5 5.1  NEUTROABS 5.7  --   --   --   HGB 12.8*   < > 11.9* 11.7*  HCT 40.7   < > 36.9* 36.4*  MCV 90.8  --  88.7 89.0  PLT 183  --  170 177   < > = values in this interval not displayed.    Basic Metabolic Panel:  Recent Labs  Lab 07/18/19 0342 07/19/19 0544  NA 136 136  K 3.8 3.9  CL 102 103  CO2 24 26  GLUCOSE 85 105*  BUN 10 13  CREATININE 1.02 1.16  CALCIUM 8.8* 8.6*    Lipid Panel:     Component Value Date/Time   CHOL 184 07/18/2019 0342   CHOL 168 08/06/2017 1244   TRIG 87 07/18/2019 0342   HDL 46 07/18/2019 0342   HDL 54 08/06/2017 1244   CHOLHDL 4.0 07/18/2019 0342   VLDL 17 07/18/2019 0342   LDLCALC 121 (H) 07/18/2019 0342   LDLCALC 95 08/06/2017 1244   HgbA1c:  Lab Results  Component Value Date   HGBA1C 5.8 (H) 07/18/2019   Urine Drug Screen:     Component Value Date/Time   LABOPIA NONE DETECTED 07/17/2019 1934   COCAINSCRNUR NONE DETECTED 07/17/2019 1934   LABBENZ NONE DETECTED 07/17/2019 1934   AMPHETMU NONE DETECTED 07/17/2019 1934   THCU NONE DETECTED 07/17/2019 1934   LABBARB NONE DETECTED 07/17/2019 1934    Alcohol Level No results found for: ETH  IMAGING  Ct Head Code Stroke Wo Contrast 07/17/2019 1. Possible hyperdense left MCA near the bifurcation.  2. No focal cortical abnormalities.  3.  ASPECTS is 10/10   Ct Code Stroke Cta Head W/wo Contrast Ct Code Stroke Cta Neck W/wo Contrast Ct Code Stroke Cta Cerebral Perfusion W/wo Contrast 07/17/2019 1. Focal area of ischemia involving the distal left ACA territory.  2. No significant large vessel occlusion associated with the left ACA.  3. Extensive distal small vessel disease throughout the circle-of-Willis.  4. No significant atherosclerotic disease in the neck.  5. Cervical spine fusion with degenerative endplate changes above and below the fused levels.   Mr Brain Wo Contrast 07/18/2019 1. Patchy small volume acute ischemic cortical infarcts involving the left frontal, parietal, and temporal lobes as above. No associated hemorrhage or mass effect. 2. Underlying mild chronic microvascular ischemic disease. No other acute intracranial abnormality.   Vas [place] Lower Extremity Venous (dvt) 07/18/2019 Right: There is no evidence of deep vein thrombosis in the lower extremity.  Left: There is no evidence of deep vein thrombosis in the lower extremity.    Transthoracic Echocardiogram   1. The left ventricle has a visually estimated ejection fraction  of approximately 50%. The cavity size was normal. There is mildly increased left ventricular wall thickness. Left ventricular diastolic Doppler parameters are consistent with impaired  relaxation.  2. The right ventricle has normal systolic function. The cavity was normal. There is no increase in right ventricular wall thickness. Right ventricular systolic pressure could not be assessed.  3. The aortic valve is tricuspid. Mild aortic annular calcification noted.  4. The mitral valve is grossly normal. Mild calcification of the mitral valve leaflet. There is mild mitral annular calcification present.  5. The tricuspid valve is grossly normal.  6. The aorta is normal in size and structure.  ECG - SR rate    83 BPM. (See cardiology reading for complete details)   PHYSICAL EXAM  Temp:  [98  F (36.7 C)-98.5 F (36.9 C)] 98 F (36.7 C) (07/27 1142) Pulse Rate:  [63-80] 66 (07/27 1142) Resp:  [8-18] 15 (07/27 1142) BP: (102-143)/(67-98) 116/77 (07/27 1142) SpO2:  [89 %-99 %] 89 % (07/27 1142) Weight:  [120.9 kg] 120.9 kg (07/26 2257)  General - Well nourished, well developed, in no apparent distress.  Ophthalmologic - fundi not visualized due to noncooperation.  Cardiovascular - Regular rate and rhythm.  Mental Status -  Level of arousal and orientation to time, place, and person were intact. Language including expression, naming, repetition, comprehension was assessed and found intact. Fund of Knowledge was assessed and was intact.  Cranial Nerves II - XII - II - Visual field intact OU. III, IV, VI - Extraocular movements intact. V - Facial sensation intact bilaterally. VII - Facial movement intact bilaterally. VIII - Hearing & vestibular intact bilaterally. X - Palate elevates symmetrically. XI - Chin turning & shoulder shrug intact bilaterally. XII - Tongue protrusion intact.  Motor Strength - The patient's strength was normal in all extremities and pronator drift was absent.  Bulk was normal and fasciculations were absent.   Motor Tone - Muscle tone was assessed at the neck and appendages and was normal.  Reflexes - The patient's reflexes were symmetrical in all extremities and he had no pathological reflexes.  Sensory - Light touch, temperature/pinprick were assessed and were symmetrical.    Coordination - The patient had normal movements in the hands and feet with no ataxia or dysmetria.  Tremor was absent.  Gait and Station - deferred.   ASSESSMENT/PLAN [name] is a 63 y.o. male with history of HTN, HLD, obesity, anxiety, arthritis with chronic back pain and thyroid dz who presented to ER with sudden onset of right leg weakness which caused a fall. On initial evaluation pt had no movement in the right leg, mild neglect and sensory loss in left  leg more than arm. He receive IV t-PA Saturday 07/17/19 at 1800.  Stroke:  Small left ACA infarcts and one punctate infarct at left MCA/PCA s/p tPA - embolic pattern - unclear source  CT head - no acute abnormality   CTA H&N - Focal area of ischemia involving the distal left ACA territory. Left ACA patent.  MRI head - patchy L ACA infarcts and one punctate at left MCA/PCA. Small vessel disease.   LE Venous Dopplers - negative for DVT  2D Echo EF 50%  TEE and loop recorder for cardioembolic work up scheduled in am  [employer] Corona Virus 2 - negative  LDL - 121  HgbA1c - 5.8  HIV neg  UDS - negative  VTE prophylaxis - SCDs  No antithrombotic prior to admission, now on  aspirin 81 mg daily and clopidogrel 75 mg daily. Continue at d.c x 3 weeks then aspirin alone  Therapy recommendations:  HH PT, OT, SLP  Disposition:  Pending  Hypertension  Stable . Gradually normalize BP in 3-5 days . Long-term BP goal normotensive  Hyperlipidemia  Lipid lowering medication PTA:  none  LDL 121, goal < 70  Current lipid lowering medication: on Lipitor 40 mg daily   Continue statin at discharge  Hypothyroidism  TSH 23.0111  free T4 normal  increase synthroid to 200 mcg daily  Close PCP follow up and monitoring  Other Stroke Risk Factors  Advanced age  Obesity, Body mass index is 37.17 kg/m., recommend weight loss, diet and exercise as appropriate   Family hx stroke (father)  Other Active Problems  Chronic hepatitis C  Methadone daily therapy for chronic LBP  Hypokalemia 3.1 - supplement -> 3.9  Thrombocytopenia [facility] day # 2  Rosalin Hawking, MD PhD Stroke Neurology 07/19/2019 12:20 PM  To contact Stroke Continuity provider, please refer to [email or web address] After hours, contact General Neurology

## 2019-07-19 NOTE — Progress Notes (Signed)
      [facility] has been requested to perform a transesophageal echocardiogram on Ricky Walter for stroke.  After careful review of history and examination, the risks and benefits of transesophageal echocardiogram have been explained including risks of esophageal damage, perforation (1:10,000 risk), bleeding, pharyngeal hematoma as well as other potential complications associated with conscious sedation including aspiration, arrhythmia, respiratory failure and death. Alternatives to treatment were discussed, questions were answered. Patient is willing to proceed.   Kathyrn Drown, NP  07/19/2019 3:52 PM

## 2019-07-19 NOTE — Discharge Summary (Addendum)
Stroke Discharge Summary  Patient ID: Ricky Walter    l   MRN: [id number]      DOB: 15-Nov-1956  Date of Admission: 07/17/2019 Date of Discharge: 07/20/2019  Attending Physician:  Marvel PlanXu, Seamus Warehime, MD, Stroke MD Consultant(s):   Charlton HawsPeter Nishan MD (cardiology -TEE),  Hillis RangeJames Allred, MD (electrophysiology-loop) Patient's PCP:  Arlyce HarmanLockamy, Timothy, DO  DISCHARGE DIAGNOSIS:  Principal Problem:   Stroke (cerebrum) (HCC) - patchy L ACA s/p tPA Active Problems:   Chronic right-sided low back pain with right-sided sciatica   Hypothyroidism   Chronic hepatitis C without hepatic coma (HCC)   Essential hypertension   Hyperlipidemia LDL goal <70   Obesity   Advanced age   Family hx-stroke   PFO (patent foramen ovale)   Status post placement of implantable loop recorder   Past Medical History:  Diagnosis Date  . Anxiety   . Arthritis   . Chronic back pain 08/07/2015  . Hyperlipidemia   . Hypertension   . Thyroid disease    Past Surgical History:  Procedure Laterality Date  . ANTERIOR FUSION CERVICAL SPINE      Allergies as of 07/20/2019      Reactions   Duloxetine    Nausea and drowsy.      Medication List    STOP taking these medications   Capsaicin 0.075 % Stck   sildenafil 20 MG tablet Commonly known as: REVATIO     TAKE these medications   acetaminophen 325 MG suppository Commonly known as: TYLENOL Place 1 suppository (325 mg total) rectally 3 (three) times daily.   amitriptyline 10 MG tablet Commonly known as: ELAVIL TAKE 1 TABLET (10 MG TOTAL) BY MOUTH AT BEDTIME.   aspirin 81 MG EC tablet Take 1 tablet (81 mg total) by mouth daily. Start taking on: July 21, 2019   atorvastatin 40 MG tablet Commonly known as: LIPITOR Take 1 tablet (40 mg total) by mouth daily at 6 PM.   clopidogrel 75 MG tablet Commonly known as: PLAVIX Take 1 tablet (75 mg total) by mouth daily for 21 days. Take with aspirin for 21 days then stop the plaivx. Start taking on: July 21, 2019    gabapentin 100 MG capsule Commonly known as: NEURONTIN Take 1 capsule (100 mg total) by mouth as needed (pain).   levothyroxine 200 MCG tablet Commonly known as: SYNTHROID Take 1 tablet (200 mcg total) by mouth daily. Start taking on: July 21, 2019 What changed:   medication strength  how much to take   methadone 10 MG/ML solution Commonly known as: DOLOPHINE Take 60 mg by mouth daily.       LABORATORY STUDIES CBC    Component Value Date/Time   WBC 5.0 07/20/2019 0342   RBC 4.26 07/20/2019 0342   HGB 12.2 (L) 07/20/2019 0342   HGB 14.1 08/06/2017 1244   HCT 38.9 (L) 07/20/2019 0342   HCT 43.4 08/06/2017 1244   PLT 185 07/20/2019 0342   PLT 214 08/06/2017 1244   MCV 91.3 07/20/2019 0342   MCV 90 08/06/2017 1244   MCH 28.6 07/20/2019 0342   MCHC 31.4 07/20/2019 0342   RDW 14.5 07/20/2019 0342   RDW 14.4 08/06/2017 1244   LYMPHSABS 1.4 07/17/2019 1743   LYMPHSABS 1.5 08/06/2017 1244   MONOABS 0.6 07/17/2019 1743   EOSABS 0.1 07/17/2019 1743   EOSABS 0.1 08/06/2017 1244   BASOSABS 0.0 07/17/2019 1743   BASOSABS 0.0 08/06/2017 1244   CMP    Component Value Date/Time  NA 137 07/20/2019 0342   NA 143 08/06/2017 1244   K 4.5 07/20/2019 0342   CL 105 07/20/2019 0342   CO2 26 07/20/2019 0342   GLUCOSE 106 (H) 07/20/2019 0342   BUN 15 07/20/2019 0342   BUN 11 08/06/2017 1244   CREATININE 1.17 07/20/2019 0342   CALCIUM 9.0 07/20/2019 0342   PROT 7.8 07/17/2019 1743   PROT 7.7 08/06/2017 1244   ALBUMIN 4.0 07/17/2019 1743   ALBUMIN 4.4 08/06/2017 1244   AST 22 07/17/2019 1743   ALT 18 07/17/2019 1743   ALKPHOS 63 07/17/2019 1743   BILITOT 0.5 07/17/2019 1743   BILITOT 0.4 08/06/2017 1244   GFRNONAA >60 07/20/2019 0342   GFRAA >60 07/20/2019 0342   COAGS Lab Results  Component Value Date   INR 1.1 07/17/2019   Lipid Panel    Component Value Date/Time   CHOL 184 07/18/2019 0342   CHOL 168 08/06/2017 1244   TRIG 87 07/18/2019 0342   HDL 46  07/18/2019 0342   HDL 54 08/06/2017 1244   CHOLHDL 4.0 07/18/2019 0342   VLDL 17 07/18/2019 0342   LDLCALC 121 (H) 07/18/2019 0342   LDLCALC 95 08/06/2017 1244   HgbA1C  Lab Results  Component Value Date   HGBA1C 5.8 (H) 07/18/2019   Urinalysis    Component Value Date/Time   COLORURINE STRAW (A) 07/17/2019 1934   APPEARANCEUR CLEAR 07/17/2019 1934   LABSPEC 1.038 (H) 07/17/2019 1934   PHURINE 6.0 07/17/2019 1934   GLUCOSEU NEGATIVE 07/17/2019 1934   HGBUR SMALL (A) 07/17/2019 1934   BILIRUBINUR NEGATIVE 07/17/2019 1934   KETONESUR NEGATIVE 07/17/2019 1934   PROTEINUR NEGATIVE 07/17/2019 1934   NITRITE NEGATIVE 07/17/2019 1934   LEUKOCYTESUR NEGATIVE 07/17/2019 1934   Urine Drug Screen     Component Value Date/Time   LABOPIA NONE DETECTED 07/17/2019 1934   COCAINSCRNUR NONE DETECTED 07/17/2019 1934   LABBENZ NONE DETECTED 07/17/2019 1934   AMPHETMU NONE DETECTED 07/17/2019 1934   THCU NONE DETECTED 07/17/2019 1934   LABBARB NONE DETECTED 07/17/2019 1934    Alcohol Level No results found for: ETH   SIGNIFICANT DIAGNOSTIC STUDIES Ct Head Code Stroke Wo Contrast 07/17/2019 1. Possible hyperdense left MCA near the bifurcation.  2. No focal cortical abnormalities.  3. ASPECTS is 10/10   Ct Code Stroke Cta Head W/wo Contrast Ct Code Stroke Cta Neck W/wo Contrast Ct Code Stroke Cta Cerebral Perfusion W/wo Contrast 07/17/2019 1. Focal area of ischemia involving the distal left ACA territory.  2. No significant large vessel occlusion associated with the left ACA.  3. Extensive distal small vessel disease throughout the circle-of-Willis.  4. No significant atherosclerotic disease in the neck.  5. Cervical spine fusion with degenerative endplate changes above and below the fused levels.   Mr Brain Wo Contrast 07/18/2019 1. Patchy small volume acute ischemic cortical infarcts involving the left frontal, parietal, and temporal lobes as above. No associated hemorrhage or  mass effect. 2. Underlying mild chronic microvascular ischemic disease. No other acute intracranial abnormality.   Vas [place] Lower Extremity Venous (dvt) 07/18/2019 Right: There is no evidence of deep vein thrombosis in the lower extremity.  Left: There is no evidence of deep vein thrombosis in the lower extremity.    Transthoracic Echocardiogram  1. The left ventricle has a visually estimated ejection fraction of approximately 50%. The cavity size was normal. There is mildly increased left ventricular wall thickness. Left ventricular diastolic Doppler parameters are consistent with impaired  relaxation. 2.  The right ventricle has normal systolic function. The cavity was normal. There is no increase in right ventricular wall thickness. Right ventricular systolic pressure could not be assessed. 3. The aortic valve is tricuspid. Mild aortic annular calcification noted. 4. The mitral valve is grossly normal. Mild calcification of the mitral valve leaflet. There is mild mitral annular calcification present. 5. The tricuspid valve is grossly normal. 6. The aorta is normal in size and structure.  TEE 07/20/2019 Normal LV size and function Normal RV size and function Normal RA Normal LA and LA appendage with no evidence of thrombus and normal emptying velocity Normal TV with trivial TR Normal PV Normal MV with trivial MR Normal trileaflet AV Lipomatous interatrial septum with evidence of PFO by agitated saline contrast injection. Normal thoracic and ascending aorta  ECG - SR rate    83 BPM. (See cardiology reading for complete details)     HISTORY OF PRESENT ILLNESS Jimmye NormanJohn D Desrosier is an 63 y.o. male with HTN, HLD, obesity, anxiety, arthritis with chronic back pain and thyroid dz who presents to ER with sudden onset of right leg weakness while walking in his front yard at 1600 on 07/17/2019 (LKW). This caused a fall, no apparent injury or complaint of pain at this time. Upon initial  evaluation emergently at the EMS bridge, pt had no movement in the right leg, mild neglect and sensory loss in left leg more than arm. He was able follow commands and had no speech or CN deficits. NIHSS 7. CTH showed no acute changes. There was some delay getting CTA/P due to poor IV access, which required the ER doctor to place line with ultrasound. CTA showed no LVO. CTP showed large perfusion deficit in the L ACA with no core. He does not take any blood thinners and there were no CIs to IV tPA. The risks and benefits were discussed with pt and he consented to thrombolytic treatment. He was admitted to the neuro ICU for further evaluation and treatment.   HOSPITAL COURSE Mr. Jimmye NormanJohn D Corum is a 63 y.o. male with history of HTN, HLD, obesity, anxiety, arthritis with chronic back pain and thyroid dz who presented to ER with sudden onset of right leg weakness which caused a fall. On initial evaluation pt had no movement in the right leg, mild neglect and sensory loss in left leg more than arm. He receive IV t-PA Saturday 07/17/19 at 1800.  Stroke:  Small left ACA infarcts and one punctate infarct at left MCA/PCA s/p tPA - embolic pattern - unclear source  CT head - no acute abnormality   CTA H&N - Focal area of ischemia involving the distal left ACA territory. Left ACA patent.  MRI head - patchy L ACA infarcts and one punctate at left MCA/PCA. Small vessel disease.   LE Venous Dopplers - negative for DVT  2D Echo EF 50%  TEE w/ lipomatous septum and PFO, no SOE  Loop recorder placed for cardioembolic monitoring   [employer] Corona Virus 2 - negative  LDL - 121  HgbA1c - 5.8  HIV neg  UDS - negative  No antithrombotic prior to admission, now on aspirin 81 mg daily and clopidogrel 75 mg daily. Continue at d.c x 3 weeks then aspirin alone  Therapy recommendations:  HH PT, OT, SLP  Disposition:  return home  PFO (patent foramen ovale)   ROPE score 5, not candidate for PFO closure given  stroke risk factors and advanced age  Stroke most likely not  related to PFO  LE venous dopplers negative for DVT   Hypertension  Stable  BP goal normotensive  BP monitoring at home  Hyperlipidemia  Lipid lowering medication PTA:  none  LDL 121, goal < 70  Current lipid lowering medication: on Lipitor 40 mg daily   Continue statin at discharge  Hypothyroidism  TSH 23.0111  free T4 normal  increase synthroid to 200 mcg daily  Close PCP follow up and monitoring  Other Stroke Risk Factors  Advanced age  Obesity, Body mass index is 37.17 kg/m., recommend weight loss, diet and exercise as appropriate   Family hx stroke (father)  Other Active Problems  Chronic hepatitis C  Methadone daily therapy for chronic LBP  Hypokalemia 3.1 - supplement -> 3.9  Thrombocytopenia 177->185  DISCHARGE EXAM Blood pressure 117/80, pulse 63, temperature 98.1 F (36.7 C), temperature source Oral, resp. rate 14, height 5\' 11"  (1.803 m), weight 120.9 kg, SpO2 98 %. General - Well nourished, well developed, in no apparent distress.  Ophthalmologic - fundi not visualized due to noncooperation.  Cardiovascular - Regular rate and rhythm.  Mental Status -  Level of arousal and orientation to time, place, and person were intact. Language including expression, naming, repetition, comprehension was assessed and found intact. Fund of Knowledge was assessed and was intact.  Cranial Nerves II - XII - II - Visual field intact OU. III, IV, VI - Extraocular movements intact. V - Facial sensation intact bilaterally. VII - Facial movement intact bilaterally. VIII - Hearing & vestibular intact bilaterally. X - Palate elevates symmetrically. XI - Chin turning & shoulder shrug intact bilaterally. XII - Tongue protrusion intact.  Motor Strength - The patient's strength was normal in all extremities and pronator drift was absent.  Bulk was normal and fasciculations were absent.    Motor Tone - Muscle tone was assessed at the neck and appendages and was normal.  Reflexes - The patient's reflexes were symmetrical in all extremities and he had no pathological reflexes.  Sensory - Light touch, temperature/pinprick were assessed and were symmetrical.    Coordination - The patient had normal movements in the hands and feet with no ataxia or dysmetria.  Tremor was absent.  Gait and Station - deferred.  Discharge Diet   Heart healthy thin liquids  DISCHARGE PLAN  Disposition:  Home  Home health PT, OT and SLP   Loop recorder placement f/u - wound check, AF detection  aspirin 81 mg daily and clopidogrel 75 mg daily for secondary stroke prevention for 3 weeks then ASPIRIN alone.  Ongoing stroke risk factor control by Primary Care Physician at time of discharge  Follow-up Lockamy, Christia Reading, DO in 2 weeks.  Follow-up in [facility] in 4 weeks, office to schedule an appointment. Follow-up [facility] for implantable loop recorder wound check in 10-14 days, office to schedule an appointment.  Loop recorder to be monitored by [facility] for atrial fibrillation as source of stroke. If found, they will notify patient.  45 minutes were spent preparing discharge.  Rosalin Hawking, MD PhD Stroke Neurology 07/20/2019 4:00 PM

## 2019-07-19 NOTE — Plan of Care (Signed)
  Problem: Coping: Goal: Will verbalize positive feelings about self 07/19/2019 0138 by Dorena Cookey, LPN Outcome: Progressing 07/18/2019 2339 by Dorena Cookey, LPN Outcome: Progressing Goal: Will identify appropriate support needs 07/19/2019 0138 by Dorena Cookey, LPN Outcome: Progressing 07/18/2019 2339 by Dorena Cookey, LPN Outcome: Progressing   Problem: Self-Care: Goal: Ability to participate in self-care as condition permits will improve 07/19/2019 0138 by Dorena Cookey, LPN Outcome: Progressing 07/18/2019 2339 by Dorena Cookey, LPN Outcome: Progressing Goal: Verbalization of feelings and concerns over difficulty with self-care will improve 07/19/2019 0138 by Dorena Cookey, LPN Outcome: Progressing 07/18/2019 2339 by Dorena Cookey, LPN Outcome: Progressing Goal: Ability to communicate needs accurately will improve 07/19/2019 0138 by Dorena Cookey, LPN Outcome: Progressing 07/18/2019 2339 by Dorena Cookey, LPN Outcome: Progressing   Problem: Nutrition: Goal: Risk of aspiration will decrease 07/19/2019 0138 by Dorena Cookey, LPN Outcome: Progressing 07/18/2019 2339 by Dorena Cookey, LPN Outcome: Progressing Goal: Dietary intake will improve 07/19/2019 0138 by Dorena Cookey, LPN Outcome: Progressing 07/18/2019 2339 by Dorena Cookey, LPN Outcome: Progressing   Problem: Ischemic Stroke/TIA Tissue Perfusion: Goal: Complications of ischemic stroke/TIA will be minimized 07/19/2019 0138 by Dorena Cookey, LPN Outcome: Progressing 07/18/2019 2339 by Dorena Cookey, LPN Outcome: Progressing   Problem: Education: Goal: Knowledge of General Education information will improve Description: Including pain rating scale, medication(s)/side effects and non-pharmacologic comfort measures 07/19/2019 0138 by Dorena Cookey, LPN Outcome: Progressing 07/18/2019 2339 by Dorena Cookey, LPN Outcome: Progressing   Problem: Clinical  Measurements: Goal: Ability to maintain clinical measurements within normal limits will improve 07/19/2019 0138 by Dorena Cookey, LPN Outcome: Progressing 07/18/2019 2339 by Dorena Cookey, LPN Outcome: Progressing Goal: Will remain free from infection 07/19/2019 0138 by Dorena Cookey, LPN Outcome: Progressing 07/18/2019 2339 by Dorena Cookey, LPN Outcome: Progressing Goal: Diagnostic test results will improve 07/19/2019 0138 by Dorena Cookey, LPN Outcome: Progressing 07/18/2019 2339 by Dorena Cookey, LPN Outcome: Progressing Goal: Respiratory complications will improve 07/19/2019 0138 by Dorena Cookey, LPN Outcome: Progressing 07/18/2019 2339 by Dorena Cookey, LPN Outcome: Progressing Goal: Cardiovascular complication will be avoided 07/19/2019 0138 by Dorena Cookey, LPN Outcome: Progressing 07/18/2019 2339 by Dorena Cookey, LPN Outcome: Progressing   Problem: Activity: Goal: Risk for activity intolerance will decrease 07/19/2019 0138 by Dorena Cookey, LPN Outcome: Progressing 07/18/2019 2339 by Dorena Cookey, LPN Outcome: Progressing   Problem: Pain Managment: Goal: General experience of comfort will improve 07/19/2019 0138 by Dorena Cookey, LPN Outcome: Progressing 07/18/2019 2339 by Dorena Cookey, LPN Outcome: Progressing   Problem: Safety: Goal: Ability to remain free from injury will improve 07/19/2019 0138 by Dorena Cookey, LPN Outcome: Progressing 07/18/2019 2339 by Dorena Cookey, LPN Outcome: Progressing   Problem: Skin Integrity: Goal: Risk for impaired skin integrity will decrease 07/19/2019 0138 by Dorena Cookey, LPN Outcome: Progressing 07/18/2019 2339 by Dorena Cookey, LPN Outcome: Progressing

## 2019-07-19 NOTE — Evaluation (Signed)
Speech Language Pathology Evaluation Patient Details Name: Ricky Walter MRN: [id number] DOB: 12/25/1955 Today's Date: 07/19/2019 Time: 5621-3086 SLP Time Calculation (min) (ACUTE ONLY): 31 min  Problem List:  Patient Active Problem List   Diagnosis Date Noted  . Stroke (cerebrum) ([place]) 07/17/2019  . Chronic hepatitis C without hepatic coma ([place]) 09/25/2017  . Mood disorder of depressed type 08/06/2017  . Chronic right-sided low back pain with right-sided sciatica 08/06/2017  . Hypothyroidism 08/06/2017  . Erectile dysfunction 08/06/2017   Past Medical History:  Past Medical History:  Diagnosis Date  . Anxiety   . Arthritis   . Chronic back pain 08/07/2015  . Hyperlipidemia   . Hypertension   . Thyroid disease    Past Surgical History:  Past Surgical History:  Procedure Laterality Date  . ANTERIOR FUSION CERVICAL SPINE     HPI:  63 y.o. male admitted on 07/17/19 for R leg weakness s/p tPA.  L frontal, parieta, and temporal lobe patchy acute infarcts on MRI.  Pt with significant PMH of HTN, chronic back pain, and anterior cervical fusion.   Assessment / Plan / Recommendation Clinical Impression  Pt scored a 25/30 on the [facility] after adjustment made for education level, with a 26 and above suggestive of normal range. Pt initially said that he was independent, but upon further conversation, he said that his fiance points out often that he is not remembering things as well. He describes this as needing reminders to trigger his recall. This is likely overall fairly consistent with performance on today's testing, and pt seems to have appropriate safety awareness. Anticipate that he is at or near baseline, but could consider f/u [facility] SLP if he were to experience any increased difficulty performing tasks upon return home. Encouraged supervision initially for any higher level cognitive tasks.     SLP Assessment  SLP Recommendation/Assessment: All further Speech Lanaguage Pathology  needs can  be addressed in the next venue of care SLP Visit Diagnosis: Cognitive communication deficit (R41.841)    Follow Up Recommendations  Home health SLP;24 hour supervision/assistance    Frequency and Duration           SLP Evaluation Cognition  Overall Cognitive Status: History of cognitive impairments - at baseline       Comprehension  Auditory Comprehension Overall Auditory Comprehension: Appears within functional limits for tasks assessed    Expression Expression Primary Mode of Expression: Verbal Verbal Expression Overall Verbal Expression: Appears within functional limits for tasks assessed Written Expression Dominant Hand: Right   Oral / Motor  Motor Speech Overall Motor Speech: Appears within functional limits for tasks assessed   GO                    Venita Sheffield Ricky Walter 07/19/2019, 11:31 AM  Pollyann Glen, M.A. [place] Acute Environmental education officer [phone] Office [phone]

## 2019-07-20 ENCOUNTER — Encounter (HOSPITAL_COMMUNITY)
Admission: EM | Disposition: A | Discharge status: Home Health | Payer: Self-pay | Source: Home / Self Care | Attending: Neurology

## 2019-07-20 ENCOUNTER — Inpatient Hospital Stay (HOSPITAL_COMMUNITY): Payer: Medicare HMO

## 2019-07-20 ENCOUNTER — Encounter (HOSPITAL_COMMUNITY): Payer: Self-pay

## 2019-07-20 DIAGNOSIS — E6609 Other obesity due to excess calories: Secondary | ICD-10-CM

## 2019-07-20 DIAGNOSIS — Q211 Atrial septal defect: Secondary | ICD-10-CM

## 2019-07-20 DIAGNOSIS — Z6830 Body mass index (BMI) 30.0-30.9, adult: Secondary | ICD-10-CM

## 2019-07-20 DIAGNOSIS — I6389 Other cerebral infarction: Secondary | ICD-10-CM

## 2019-07-20 DIAGNOSIS — Q2112 Patent foramen ovale: Secondary | ICD-10-CM

## 2019-07-20 DIAGNOSIS — G8929 Other chronic pain: Secondary | ICD-10-CM

## 2019-07-20 DIAGNOSIS — M5441 Lumbago with sciatica, right side: Secondary | ICD-10-CM

## 2019-07-20 DIAGNOSIS — Z95818 Presence of other cardiac implants and grafts: Secondary | ICD-10-CM | POA: Diagnosis not present

## 2019-07-20 HISTORY — PX: LOOP RECORDER INSERTION: EP1214

## 2019-07-20 HISTORY — PX: BUBBLE STUDY: SHX6837

## 2019-07-20 HISTORY — PX: TEE WITHOUT CARDIOVERSION: SHX5443

## 2019-07-20 LAB — BASIC METABOLIC PANEL
Anion gap: 6 (ref 5–15)
BUN: 15 mg/dL (ref 8–23)
CO2: 26 mmol/L (ref 22–32)
Calcium: 9 mg/dL (ref 8.9–10.3)
Chloride: 105 mmol/L (ref 98–111)
Creatinine, Ser: 1.17 mg/dL (ref 0.61–1.24)
GFR calc Af Amer: >60 mL/min (ref >60)
GFR calc non Af Amer: >60 mL/min (ref >60)
Glucose, Bld: 106 mg/dL — ABNORMAL HIGH (ref 70–99)
Potassium: 4.5 mmol/L (ref 3.5–5.1)
Sodium: 137 mmol/L (ref 135–145)

## 2019-07-20 LAB — CBC
HCT: 38.9 % — ABNORMAL LOW (ref 39.0–52.0)
Hemoglobin: 12.2 g/dL — ABNORMAL LOW (ref 13.0–17.0)
MCH: 28.6 pg (ref 26.0–34.0)
MCHC: 31.4 g/dL (ref 30.0–36.0)
MCV: 91.3 fL (ref 80.0–100.0)
Platelets: 185 10*3/uL (ref 150–400)
RBC: 4.26 MIL/uL (ref 4.22–5.81)
RDW: 14.5 % (ref 11.5–15.5)
WBC: 5 10*3/uL (ref 4.0–10.5)
nRBC: 0 % (ref 0.0–0.2)

## 2019-07-20 LAB — GLUCOSE, CAPILLARY: Glucose-Capillary: 98 mg/dL (ref 70–99)

## 2019-07-20 SURGERY — ECHOCARDIOGRAM, TRANSESOPHAGEAL
Anesthesia: Moderate Sedation

## 2019-07-20 SURGERY — LOOP RECORDER INSERTION

## 2019-07-20 MED ORDER — LEVOTHYROXINE SODIUM 200 MCG PO TABS: 200.0000 ug | ORAL_TABLET | Freq: Every day | ORAL | 2 refills | Status: DC

## 2019-07-20 MED ORDER — GABAPENTIN 100 MG PO CAPS: 100.0000 mg | ORAL_CAPSULE | ORAL | Status: DC | PRN

## 2019-07-20 MED ORDER — CLOPIDOGREL BISULFATE 75 MG PO TABS: 75.0000 mg | ORAL_TABLET | Freq: Every day | ORAL | 0 refills | Status: AC

## 2019-07-20 MED ORDER — FENTANYL CITRATE (PF) 100 MCG/2ML IJ SOLN
INTRAMUSCULAR | Status: AC
  Filled 2019-07-20: qty 2

## 2019-07-20 MED ORDER — DIPHENHYDRAMINE HCL 50 MG/ML IJ SOLN
INTRAMUSCULAR | Status: AC
  Filled 2019-07-20: qty 1

## 2019-07-20 MED ORDER — ASPIRIN 81 MG PO TBEC: 81.0000 mg | DELAYED_RELEASE_TABLET | Freq: Every day | ORAL | Status: DC

## 2019-07-20 MED ORDER — LIDOCAINE-EPINEPHRINE 1 %-1:100000 IJ SOLN
INTRAMUSCULAR | Status: DC | PRN
  Administered 2019-07-20: 15 mL

## 2019-07-20 MED ORDER — MIDAZOLAM HCL (PF) 5 MG/ML IJ SOLN
INTRAMUSCULAR | Status: AC
  Filled 2019-07-20: qty 2

## 2019-07-20 MED ORDER — ATORVASTATIN CALCIUM 40 MG PO TABS: 40.0000 mg | ORAL_TABLET | Freq: Every day | ORAL | 2 refills | Status: DC

## 2019-07-20 MED ORDER — BUTAMBEN-TETRACAINE-BENZOCAINE 2-2-14 % EX AERO
INHALATION_SPRAY | CUTANEOUS | Status: DC | PRN
  Administered 2019-07-20: 2 via TOPICAL

## 2019-07-20 MED ORDER — SODIUM CHLORIDE 0.9 % IV SOLN
INTRAVENOUS | Status: DC
  Administered 2019-07-20: 08:00:00 via INTRAVENOUS

## 2019-07-20 MED ORDER — MIDAZOLAM HCL (PF) 10 MG/2ML IJ SOLN
INTRAMUSCULAR | Status: DC | PRN
  Administered 2019-07-20: 1 mg via INTRAVENOUS
  Administered 2019-07-20: 2 mg via INTRAVENOUS

## 2019-07-20 MED ORDER — FENTANYL CITRATE (PF) 100 MCG/2ML IJ SOLN
INTRAMUSCULAR | Status: DC | PRN
  Administered 2019-07-20: 25 ug via INTRAVENOUS

## 2019-07-20 MED ORDER — LIDOCAINE-EPINEPHRINE 1 %-1:100000 IJ SOLN
INTRAMUSCULAR | Status: AC
  Filled 2019-07-20: qty 1

## 2019-07-20 SURGICAL SUPPLY — 2 items
LOOP REVEAL LINQSYS (Prosthesis & Implant Heart) ×1 IMPLANT
PACK LOOP INSERTION (CUSTOM PROCEDURE TRAY) ×2 IMPLANT

## 2019-07-20 NOTE — Interval H&P Note (Signed)
History and Physical Interval Note:  07/20/2019 4:45 PM  Ricky Walter  has presented today for surgery, with the diagnosis of stroke.  The various methods of treatment have been discussed with the patient and family. After consideration of risks, benefits and other options for treatment, the patient has consented to  Procedure(s): LOOP RECORDER INSERTION (N/A) as a surgical intervention.  The patient's history has been reviewed, patient examined, no change in status, stable for surgery.  I have reviewed the patient's chart and labs.  Questions were answered to the patient's satisfaction.     [name]

## 2019-07-20 NOTE — Discharge Instructions (Signed)
Implant site/wound care instructions °Keep incision clean and dry for 3 days. °You can remove outer dressing tomorrow. °Leave steri-strips (little pieces of tape) on until seen in the office for wound check appointment. °Call the office ([phone]) for redness, drainage, swelling, or fever. ° °

## 2019-07-20 NOTE — Progress Notes (Signed)
PT Cancellation Note  Patient Details Name: Ricky Walter MRN: [id number] DOB: 07-23-1956   Cancelled Treatment:    Reason Eval/Treat Not Completed: Patient at procedure or test/unavailable At TEE. Will follow up as time allows.   Lanney Gins, PT, DPT Supplemental Physical Therapist 07/20/19 8:04 AM Pager: [phone] Office: [phone]

## 2019-07-20 NOTE — H&P (View-Only) (Signed)
ELECTROPHYSIOLOGY CONSULT NOTE  Patient ID: Ricky Walter MRN: [id number], DOB/AGE: August 22, 1956   Admit date: 07/17/2019 Date of Consult: 07/20/2019  Primary Physician: Arlyce Harman, DO Primary Cardiologist: none Reason for Consultation: Cryptogenic stroke ; recommendations regarding Implantable Loop Recorder, requested by Dr. Roda Shutters  History of Present Illness Ricky Walter was admitted on 07/17/2019 with R leg weakness, fall, dx with stroke, received tPA.    PMHx includes HTN, HLD, obesity, anxiety, arthritis, hypothyroidism, chronic back pain Neurology noted: Small left ACA infarcts and one punctate infarct at left MCA/PCA s/p tPA - embolic pattern - unclear source.  he has undergone workup for stroke including echocardiogram and carotid angio.  The patient has been monitored on telemetry which has demonstrated sinus rhythm with no arrhythmias.  Inpatient stroke work-up is to be completed with a TEE.   Echocardiogram this admission demonstrated 1. The left ventricle has a visually estimated ejection fraction of approximately 50%. The cavity size was normal. There is mildly increased left ventricular wall thickness. Left ventricular diastolic Doppler parameters are consistent with impaired  relaxation. 2. The right ventricle has normal systolic function. The cavity was normal. There is no increase in right ventricular wall thickness. Right ventricular systolic pressure could not be assessed. 3. The aortic valve is tricuspid. Mild aortic annular calcification noted. 4. The mitral valve is grossly normal. Mild calcification of the mitral valve leaflet. There is mild mitral annular calcification present. 5. The tricuspid valve is grossly normal. 6. The aorta is normal in size and structure.    Lab work is reviewed.   Prior to admission, the patient denies chest pain, shortness of breath, dizziness, palpitations, or syncope.   Rarely get the feeling her has a catch in his breath. They  is recovering from their stroke well with plans to home at discharge.   Past Medical History:  Diagnosis Date   Anxiety    Arthritis    Chronic back pain 08/07/2015   Hyperlipidemia    Hypertension    Thyroid disease      Surgical History:  Past Surgical History:  Procedure Laterality Date   ANTERIOR FUSION CERVICAL SPINE       Medications Prior to Admission  Medication Sig Dispense Refill Last Dose   amitriptyline (ELAVIL) 10 MG tablet TAKE 1 TABLET (10 MG TOTAL) BY MOUTH AT BEDTIME. 90 tablet 3 Past Week at Unknown time   gabapentin (NEURONTIN) 100 MG capsule Take 2 capsules (200 mg total) by mouth 3 (three) times daily. (Patient taking differently: Take 100 mg by mouth as needed (pain). ) 180 capsule 3 Past Month at Unknown time   levothyroxine (SYNTHROID, LEVOTHROID) 150 MCG tablet TAKE 1 TABLET (150 MCG TOTAL) BY MOUTH DAILY. 90 tablet 3 07/17/2019 at Unknown time   methadone (DOLOPHINE) 10 MG/ML solution Take 60 mg by mouth daily.   07/17/2019 at Unknown time   acetaminophen (TYLENOL) 325 MG suppository Place 1 suppository (325 mg total) rectally 3 (three) times daily.      Capsaicin 0.075 % STCK Apply to low back 2 x daily (Patient not taking: Reported on 07/18/2019) 1 g 12 Not Taking at Unknown time   sildenafil (REVATIO) 20 MG tablet Take 5 tablets (100 mg) at once 1 hour prior to sexual activity as needed. Do not exceed 100 mg daily. (Patient not taking: Reported on 07/18/2019) 30 tablet 0 Not Taking at Unknown time    Inpatient Medications:   aspirin EC  81 mg Oral Daily  atorvastatin  40 mg Oral q1800   Chlorhexidine Gluconate Cloth  6 each Topical Q0600   clopidogrel  75 mg Oral Daily   levothyroxine  200 mcg Oral Daily   methadone  60 mg Oral Daily   pantoprazole  40 mg Oral Daily   sodium chloride flush  3 mL Intravenous Once    Allergies:  Allergies  Allergen Reactions   Duloxetine     Nausea and drowsy.    Social History    Socioeconomic History   Marital status: Divorced    Spouse name: Not on file   Number of children: Not on file   Years of education: Not on file   Highest education level: Not on file  Occupational History   Not on file  Social Needs   Financial resource strain: Not on file   Food insecurity    Worry: Not on file    Inability: Not on file   Transportation needs    Medical: Not on file    Non-medical: Not on file  Tobacco Use   Smoking status: Never Smoker   Smokeless tobacco: Never Used  Substance and Sexual Activity   Alcohol use: No   Drug use: No   Sexual activity: Yes    Birth control/protection: None  Lifestyle   Physical activity    Days per week: Not on file    Minutes per session: Not on file   Stress: Not on file  Relationships   Social connections    Talks on phone: Not on file    Gets together: Not on file    Attends religious service: Not on file    Active member of club or organization: Not on file    Attends meetings of clubs or organizations: Not on file    Relationship status: Not on file   Intimate partner violence    Fear of current or ex partner: Not on file    Emotionally abused: Not on file    Physically abused: Not on file    Forced sexual activity: Not on file  Other Topics Concern   Not on file  Social History Narrative   Not on file     Family History  Problem Relation Age of Onset   Hyperlipidemia Mother    Hypertension Mother    Thyroid disease Mother    Hyperlipidemia Father    Hypertension Father    Stroke Father       Review of Systems: All other systems reviewed and are otherwise negative except as noted above.  Physical Exam: Vitals:   07/19/19 1622 07/19/19 2048 07/19/19 2323 07/20/19 0338  BP: 123/89 111/76 107/78 107/74  Pulse: 69 71 72 70  Resp: 17 18 18 18   Temp: 97.9 F (36.6 C) 98.1 F (36.7 C) 98.5 F (36.9 C) 98.4 F (36.9 C)  TempSrc: Oral Oral Oral Oral  SpO2: 97% 93% 96%  95%  Weight:      Height:        GEN- The patient is well appearing, alert and oriented x 3 today.   Head- normocephalic, atraumatic Eyes-  Sclera clear, conjunctiva pink Ears- hearing intact Oropharynx- not examined Neck- supple Lungs- CTA b/l, normal work of breathing Heart- RRR, no murmurs, rubs or gallops  GI- soft, NT, ND, obese Extremities- no clubbing, cyanosis, or edema MS- no significant deformity or atrophy Skin- no rash or lesion Psych- euthymic mood, full affect   Labs:   Lab Results  Component Value Date  WBC 5.0 07/20/2019   HGB 12.2 (L) 07/20/2019   HCT 38.9 (L) 07/20/2019   MCV 91.3 07/20/2019   PLT 185 07/20/2019    Recent Labs  Lab 07/17/19 1743  07/20/19 0342  NA 137   < > 137  K 4.0   < > 4.5  CL 104   < > 105  CO2 23   < > 26  BUN 12   < > 15  CREATININE 1.17   < > 1.17  CALCIUM 9.2   < > 9.0  PROT 7.8  --   --   BILITOT 0.5  --   --   ALKPHOS 63  --   --   ALT 18  --   --   AST 22  --   --   GLUCOSE 104*   < > 106*   < > = values in this interval not displayed.   No results found for: CKTOTAL, CKMB, CKMBINDEX, TROPONINI Lab Results  Component Value Date   CHOL 184 07/18/2019   CHOL 168 08/06/2017   Lab Results  Component Value Date   HDL 46 07/18/2019   HDL 54 08/06/2017   Lab Results  Component Value Date   LDLCALC 121 (H) 07/18/2019   LDLCALC 95 08/06/2017   Lab Results  Component Value Date   TRIG 87 07/18/2019   TRIG 96 08/06/2017   Lab Results  Component Value Date   CHOLHDL 4.0 07/18/2019   CHOLHDL 3.1 08/06/2017   No results found for: LDLDIRECT  No results found for: DDIMER   Radiology/Studies:   Ct Code Stroke Cta Head W/wo Contrast Result Date: 07/17/2019 CLINICAL DATA:  Sudden onset of right upper extremity weakness. EXAM: CT ANGIOGRAPHY HEAD AND NECK CT PERFUSION BRAIN TECHNIQUE: Multidetector CT imaging of the head and neck was performed using the standard protocol during bolus administration of  intravenous contrast. Multiplanar CT image reconstructions and MIPs were obtained to evaluate the vascular anatomy. Carotid stenosis measurements (when applicable) are obtained utilizing NASCET criteria, using the distal internal carotid diameter as the denominator. Multiphase CT imaging of the brain was performed following IV bolus contrast injection. Subsequent parametric perfusion maps were calculated using RAPID software. CONTRAST:  90mL OMNIPAQUE IOHEXOL 350 MG/ML SOLN COMPARISON:  CT head without contrast of the same day. FINDINGS: CTA NECK FINDINGS Aortic arch: There is a common origin of the left common carotid artery and innominate artery. No significant vascular calcifications or stenosis are present at the aortic arch. Right carotid system: The right common carotid artery within normal limits. Right carotid bifurcation is unremarkable. Cervical right ICA is normal. Left carotid system: The left common carotid artery is within normal limits. Minimal atherosclerotic changes are present bifurcation. There is no significant stenosis. The cervical left ICA is normal. Vertebral arteries: The vertebral arteries originate from the subclavian arteries bilaterally. There is no significant stenosis at the origins. No focal stenosis or vascular injury is present in either vertebral artery in the neck. Skeleton: Cervical spine fusion is noted at C3-4, C4-5, and C5-6. Chronic endplate changes are present at C6-7 and C7-T1. Chronic endplate changes are present at C2-3. No focal lytic blastic lesions are present. Patient has 2 residual mandibular teeth, both with chronic periapical lucencies. Other neck: No focal mucosal or submucosal lesions are present. The salivary glands are within normal limits. Thyroid is normal. No significant adenopathy is present. Upper chest: Lung apices are clear without focal nodule, mass, or airspace disease. Thoracic inlet is within  normal limits. The upper mediastinum is unremarkable.  Review of the MIP images confirms the above findings CTA HEAD FINDINGS Anterior circulation: Minimal atherosclerotic changes are present within the scratched at the internal carotid arteries are within normal limits from the high cervical segments through the ICA termini bilaterally. The A1 and M1 segments are normal. The left A1 segment is dominant. MCA bifurcations are within normal limits. The MCA branch vessels are normal bilaterally. No significant stenosis or occlusion is present. There is mild irregularity of distal ACA branch vessels without a significant focal stenosis or occlusion. Posterior circulation: The vertebral arteries are codominant. PICA origins are visualized and normal. Basilar artery is normal. Both posterior cerebral arteries originate from the basilar tip. Small posterior communicating arteries are patent bilaterally. PCA vessels demonstrate mild distal narrowing without a significant proximal stenosis or occlusion. There is no aneurysm. Venous sinuses: The dural sinuses are patent. Right transverse sinus is dominant. Straight sinus deep cerebral veins are intact. Cortical veins are within normal limits bilaterally. Anatomic variants: None Review of the MIP images confirms the above findings CT Brain Perfusion Findings: ASPECTS: 10/10 CBF (<30%) Volume: 0mL Perfusion (Tmax>6.0s) volume: 17mL Mismatch Volume: 17mL Infarction Location:Distal left ACA territory. IMPRESSION: 1. Focal area of ischemia involving the distal left ACA territory. 2. No significant large vessel occlusion associated with the left ACA. 3. Extensive distal small vessel disease throughout the circle-of-Willis. 4. No significant atherosclerotic disease in the neck. 5. Cervical spine fusion with degenerative endplate changes above and below the fused levels. Electronically Signed   By: Marin Robertshristopher  Mattern M.D.   On: 07/17/2019 18:44     Mr Brain Wo Contrast Result Date: 07/18/2019 CLINICAL DATA:  Follow-up examination  for acute stroke, status post tPA. EXAM: MRI HEAD WITHOUT CONTRAST TECHNIQUE: Multiplanar, multiecho pulse sequences of the brain and surrounding structures were obtained without intravenous contrast. COMPARISON:  Prior CT from 07/17/2019. FINDINGS: Brain: Cerebral volume within normal limits for age. Mild scattered patchy T2/FLAIR hyperintensity within the periventricular and deep white matter both cerebral hemispheres most consistent with chronic small vessel ischemic disease. Mild chronic microvascular ischemic changes present within the pons as well. Patchy focus of restricted diffusion seen involving the parasagittal anterior left frontal lobe, compatible with acute ischemic infarct (series 5, image 89). Additional patchy small volume cortical infarcts seen involving the high left parietal lobe (series 5, image 98). Punctate focus of ischemia present at the mesial posterior left temporal lobe (series 5, image 76). No associated hemorrhage or mass effect. No other evidence for acute or subacute ischemia. Gray-white matter differentiation otherwise maintained. No foci of susceptibility artifact to suggest acute or chronic intracranial hemorrhage. No mass lesion, midline shift or mass effect. No hydrocephalus. No extra-axial fluid collection. Pituitary gland suprasellar region within normal limits. Midline structures intact. Vascular: Major intracranial vascular flow voids are maintained. Skull and upper cervical spine: Craniocervical junction within normal limits. Postsurgical changes partially visualize within the upper cervical spine. Bone marrow signal intensity within normal limits. No scalp soft tissue abnormality. Sinuses/Orbits: Globes and orbital soft tissues within normal limits. Paranasal sinuses are clear. No mastoid effusion. Inner ear structures normal. Other: None. IMPRESSION: 1. Patchy small volume acute ischemic cortical infarcts involving the left frontal, parietal, and temporal lobes as above.  No associated hemorrhage or mass effect. 2. Underlying mild chronic microvascular ischemic disease. No other acute intracranial abnormality. Electronically Signed   By: Rise MuBenjamin  McClintock M.D.   On: 07/18/2019 18:54     Ct Head Code Stroke  Wo Contrast Result Date: 07/17/2019 CLINICAL DATA:  Code stroke. Sudden onset of right upper extremity weakness. EXAM: CT HEAD WITHOUT CONTRAST TECHNIQUE: Contiguous axial images were obtained from the base of the skull through the vertex without intravenous contrast. COMPARISON:  None. FINDINGS: Brain: No acute infarct, hemorrhage, or mass lesion is present. No significant white matter lesions are present. The ventricles are of normal size. No significant extraaxial fluid collection is present. The brainstem and cerebellum are within normal limits. Vascular: There is a possible hyperdense left MCA near the bifurcation. No significant vascular calcifications are present. Skull: Calvarium is intact. No focal lytic or blastic lesions are present. Sinuses/Orbits: The paranasal sinuses and mastoid air cells are clear. The globes and orbits are within normal limits. ASPECTS [facility]([place] Stroke Program Early CT Score) - Ganglionic level infarction (caudate, lentiform nuclei, internal capsule, insula, M1-M3 cortex): 7/7 - Supraganglionic infarction (M4-M6 cortex): 3/3 Total score (0-10 with 10 being normal): 10/10 IMPRESSION: 1. Possible hyperdense left MCA near the bifurcation. 2. No focal cortical abnormalities. 3. ASPECTS is 10/10 The above was relayed via text pager to Dr. Laurence SlateAroor on 07/17/2019 at 18:07 . Electronically Signed   By: Marin Robertshristopher  Mattern M.D.   On: 07/17/2019 18:07     Vas [place] Lower Extremity Venous (dvt) Result Date: 07/18/2019  Lower Venous Study Indications: Stroke.  Comparison Study: No prior study noted for comparison Performing Technologist: Sherren Kernsandace Kanady RVS  Examination Guidelines: A complete evaluation includes B-mode imaging, spectral Doppler, color  Doppler, and power Doppler as needed of all accessible portions of each vessel. Bilateral testing is considered an integral part of a complete examination. Limited examinations for reoccurring indications may be performed as noted.   Summary: Right: There is no evidence of deep vein thrombosis in the lower extremity. Left: There is no evidence of deep vein thrombosis in the lower extremity.  *See table(s) above for measurements and observations. Electronically signed by Waverly Ferrarihristopher Dickson MD on 07/18/2019 at 7:10:43 PM.    Final     12-lead ECG SR All prior EKG's in EPIC reviewed with no documented atrial fibrillation  Telemetry SR  Assessment and Plan:  1. Cryptogenic stroke The patient presents with cryptogenic stroke.  The patient has a TEE planned for this AM.  I spoke at length with the patient about monitoring for afib with either a 30 day event monitor or an implantable loop recorder.  Risks, benefits, and alteratives to implantable loop recorder were discussed with the patient today.   At this time, the patient is very clear in his decision to proceed with implantable loop recorder.   Wound care was reviewed with the patient (keep incision clean and dry for 3 days).  Wound check will be scheduled for the patient  Please call with questions.   Renee Norberto SorensonLynn Ursuy, PA-C 07/20/2019  I have seen, examined the patient, and reviewed the above assessment and plan.  Changes to above are made where necessary.  On exam, RRR.    The patient presents with cryptogenic stroke. He has been monitored without afib observed. Risks, benefits, and alteratives to implantable loop recorder were discussed with the patient today.   At this time, the patient is very clear in their decision to proceed with implantable loop recorder.    Co Sign: Hillis RangeJames Rane Dumm, MD 07/20/2019 4:43 PM

## 2019-07-20 NOTE — TOC Initial Note (Signed)
Transition of Care [facility]) - Initial/Assessment Note    Patient Details  Name: Ricky Walter MRN: [id number] Date of Birth: 1956-06-03  [facility]) CM/SW Contact:    Geralynn Ochs, LCSW Phone Number: 07/20/2019, 2:42 PM  Clinical Narrative:    CSW met with patient to discuss transition home. Patient agreeable to home health, set up with [facility]. Patient has a cane at home and wife available to assist if needed. Patient will have a ride home if discharged today.                Expected Discharge Plan: [place]     Patient Goals and CMS Choice Patient states their goals for this hospitalization and ongoing recovery are:: to get back home, get back to gardening CMS Medicare.gov Compare Post Acute Care list provided to:: Patient Choice offered to / list presented to : Patient  Expected Discharge Plan and Services Expected Discharge Plan: [place] Choice: [place] arrangements for the past 2 months: Single Family Home                           HH Arranged: PT, OT, Speech Therapy [place]: [facility] (formerly [employer]) Date [place]: 07/20/19 Time [place]: 1441 Representative spoke with at [place]: [place] Arrangements/Services Living arrangements for the past 2 months: [place] Lives with:: Spouse Patient language and need for interpreter reviewed:: No Do you feel safe going back to the place where you live?: Yes      Need for Family Participation in Patient Care: No (Comment) Care giver support system in place?: Yes (comment) Current home services: DME Criminal Activity/Legal Involvement Pertinent to Current Situation/Hospitalization: No - Comment as needed  Activities of Daily Living Home Assistive Devices/Equipment: Cane (specify quad or straight) ADL Screening (condition at time of admission) Patient's  cognitive ability adequate to safely complete daily activities?: Yes Is the patient deaf or have difficulty hearing?: No Does the patient have difficulty seeing, even when wearing glasses/contacts?: No Does the patient have difficulty concentrating, remembering, or making decisions?: No Patient able to express need for assistance with ADLs?: Yes Does the patient have difficulty dressing or bathing?: No Independently performs ADLs?: Yes (appropriate for developmental age) Does the patient have difficulty walking or climbing stairs?: Yes Weakness of Legs: Both Weakness of Arms/Hands: None  Permission Sought/Granted                  Emotional Assessment Appearance:: Appears stated age Attitude/Demeanor/Rapport: Engaged Affect (typically observed): Pleasant Orientation: : Oriented to Self, Oriented to Place, Oriented to  Time, Oriented to Situation Alcohol / Substance Use: Not Applicable Psych Involvement: No (comment)  Admission diagnosis:  CODE STROKE Patient Active Problem List   Diagnosis Date Noted  . PFO (patent foramen ovale) 07/20/2019  . Status post placement of implantable loop recorder 07/20/2019  . Essential hypertension 07/19/2019  . Hyperlipidemia LDL goal <70 07/19/2019  . Obesity 07/19/2019  . Advanced age 63/27/2020  . Family hx-stroke 07/19/2019  . Stroke (cerebrum) ([place]) - patchy L ACA s/p tPA 07/17/2019  . Chronic hepatitis C without hepatic coma ([place]) 09/25/2017  . Mood disorder of depressed type 08/06/2017  . Chronic right-sided low back pain with right-sided sciatica 08/06/2017  . Hypothyroidism 08/06/2017  . Erectile dysfunction 08/06/2017  PCP:  Nuala Alpha, DO Pharmacy:   [place], [place] - [address] [place] [address] Phone: [phone] Fax: [phone]     Social Determinants of Health (SDOH) Interventions    Readmission Risk Interventions No flowsheet data found.

## 2019-07-20 NOTE — Consult Note (Addendum)
° ° °ELECTROPHYSIOLOGY CONSULT NOTE  °Patient ID: Ricky Walter °MRN: [id number], DOB/AGE: 63/01/1956  ° °Admit date: 07/17/2019 °Date of Consult: 07/20/2019 ° °Primary Physician: Lockamy, Timothy, DO °Primary Cardiologist: none °Reason for Consultation: Cryptogenic stroke ; recommendations regarding Implantable Loop Recorder, requested by Dr. Xu ° °History of Present Illness °Ricky Walter was admitted on 07/17/2019 with R leg weakness, fall, dx with stroke, received tPA.    °PMHx includes HTN, HLD, obesity, anxiety, arthritis, hypothyroidism, chronic back pain °Neurology noted: Small left ACA infarcts and one punctate infarct at left MCA/PCA s/p tPA - embolic pattern - unclear source.  he has undergone workup for stroke including echocardiogram and carotid angio.  The patient has been monitored on telemetry which has demonstrated sinus rhythm with no arrhythmias.  Inpatient stroke work-up is to be completed with a TEE.  ° °Echocardiogram this admission demonstrated °1. The left ventricle has a visually estimated ejection fraction of approximately 50%. The cavity size was normal. There is mildly increased left ventricular wall thickness. Left ventricular diastolic Doppler parameters are consistent with impaired  °relaxation. ° 2. The right ventricle has normal systolic function. The cavity was normal. There is no increase in right ventricular wall thickness. Right ventricular systolic pressure could not be assessed. ° 3. The aortic valve is tricuspid. Mild aortic annular calcification noted. ° 4. The mitral valve is grossly normal. Mild calcification of the mitral valve leaflet. There is mild mitral annular calcification present. ° 5. The tricuspid valve is grossly normal. ° 6. The aorta is normal in size and structure. ° ° ° °Lab work is reviewed. ° ° °Prior to admission, the patient denies chest pain, shortness of breath, dizziness, palpitations, or syncope.   Rarely get the feeling her has a catch in his breath. They  is recovering from their stroke well with plans to home at discharge. ° ° °Past Medical History:  °Diagnosis Date  °• Anxiety   °• Arthritis   °• Chronic back pain 08/07/2015  °• Hyperlipidemia   °• Hypertension   °• Thyroid disease   °  ° °Surgical History:  °Past Surgical History:  °Procedure Laterality Date  °• ANTERIOR FUSION CERVICAL SPINE    °  ° °Medications Prior to Admission  °Medication Sig Dispense Refill Last Dose  °• amitriptyline (ELAVIL) 10 MG tablet TAKE 1 TABLET (10 MG TOTAL) BY MOUTH AT BEDTIME. 90 tablet 3 Past Week at Unknown time  °• gabapentin (NEURONTIN) 100 MG capsule Take 2 capsules (200 mg total) by mouth 3 (three) times daily. (Patient taking differently: Take 100 mg by mouth as needed (pain). ) 180 capsule 3 Past Month at Unknown time  °• levothyroxine (SYNTHROID, LEVOTHROID) 150 MCG tablet TAKE 1 TABLET (150 MCG TOTAL) BY MOUTH DAILY. 90 tablet 3 07/17/2019 at Unknown time  °• methadone (DOLOPHINE) 10 MG/ML solution Take 60 mg by mouth daily.   07/17/2019 at Unknown time  °• acetaminophen (TYLENOL) 325 MG suppository Place 1 suppository (325 mg total) rectally 3 (three) times daily.     °• Capsaicin 0.075 % STCK Apply to low back 2 x daily (Patient not taking: Reported on 07/18/2019) 1 g 12 Not Taking at Unknown time  °• sildenafil (REVATIO) 20 MG tablet Take 5 tablets (100 mg) at once 1 hour prior to sexual activity as needed. Do not exceed 100 mg daily. (Patient not taking: Reported on 07/18/2019) 30 tablet 0 Not Taking at Unknown time  ° ° °Inpatient Medications:  °• aspirin EC  81 mg Oral Daily  °•   atorvastatin  40 mg Oral q1800  °• Chlorhexidine Gluconate Cloth  6 each Topical Q0600  °• clopidogrel  75 mg Oral Daily  °• levothyroxine  200 mcg Oral Daily  °• methadone  60 mg Oral Daily  °• pantoprazole  40 mg Oral Daily  °• sodium chloride flush  3 mL Intravenous Once  ° ° °Allergies:  °Allergies  °Allergen Reactions  °• Duloxetine   °  Nausea and drowsy.  ° ° °Social History   ° °Socioeconomic History  °• Marital status: Divorced  °  Spouse name: Not on file  °• Number of children: Not on file  °• Years of education: Not on file  °• Highest education level: Not on file  °Occupational History  °• Not on file  °Social Needs  °• Financial resource strain: Not on file  °• Food insecurity  °  Worry: Not on file  °  Inability: Not on file  °• Transportation needs  °  Medical: Not on file  °  Non-medical: Not on file  °Tobacco Use  °• Smoking status: Never Smoker  °• Smokeless tobacco: Never Used  °Substance and Sexual Activity  °• Alcohol use: No  °• Drug use: No  °• Sexual activity: Yes  °  Birth control/protection: None  °Lifestyle  °• Physical activity  °  Days per week: Not on file  °  Minutes per session: Not on file  °• Stress: Not on file  °Relationships  °• Social connections  °  Talks on phone: Not on file  °  Gets together: Not on file  °  Attends religious service: Not on file  °  Active member of club or organization: Not on file  °  Attends meetings of clubs or organizations: Not on file  °  Relationship status: Not on file  °• Intimate partner violence  °  Fear of current or ex partner: Not on file  °  Emotionally abused: Not on file  °  Physically abused: Not on file  °  Forced sexual activity: Not on file  °Other Topics Concern  °• Not on file  °Social History Narrative  °• Not on file  °  ° °Family History  °Problem Relation Age of Onset  °• Hyperlipidemia Mother   °• Hypertension Mother   °• Thyroid disease Mother   °• Hyperlipidemia Father   °• Hypertension Father   °• Stroke Father   °  ° ° °Review of Systems: °All other systems reviewed and are otherwise negative except as noted above. ° °Physical Exam: °Vitals:  ° 07/19/19 1622 07/19/19 2048 07/19/19 2323 07/20/19 0338  °BP: 123/89 111/76 107/78 107/74  °Pulse: 69 71 72 70  °Resp: 17 18 18 18  °Temp: 97.9 °F (36.6 °C) 98.1 °F (36.7 °C) 98.5 °F (36.9 °C) 98.4 °F (36.9 °C)  °TempSrc: Oral Oral Oral Oral  °SpO2: 97% 93% 96%  95%  °Weight:      °Height:      ° ° °GEN- The patient is well appearing, alert and oriented x 3 today.   °Head- normocephalic, atraumatic °Eyes-  Sclera clear, conjunctiva pink °Ears- hearing intact °Oropharynx- not examined °Neck- supple °Lungs- CTA b/l, normal work of breathing °Heart- RRR, no murmurs, rubs or gallops  °GI- soft, NT, ND, obese °Extremities- no clubbing, cyanosis, or edema °MS- no significant deformity or atrophy °Skin- no rash or lesion °Psych- euthymic mood, full affect ° ° °Labs: °  °Lab Results  °Component Value Date  °   WBC 5.0 07/20/2019  ° HGB 12.2 (L) 07/20/2019  ° HCT 38.9 (L) 07/20/2019  ° MCV 91.3 07/20/2019  ° PLT 185 07/20/2019  °  °Recent Labs  °Lab 07/17/19 °1743  07/20/19 °0342  °NA 137   < > 137  °K 4.0   < > 4.5  °CL 104   < > 105  °CO2 23   < > 26  °BUN 12   < > 15  °CREATININE 1.17   < > 1.17  °CALCIUM 9.2   < > 9.0  °PROT 7.8  --   --   °BILITOT 0.5  --   --   °ALKPHOS 63  --   --   °ALT 18  --   --   °AST 22  --   --   °GLUCOSE 104*   < > 106*  ° < > = values in this interval not displayed.  ° °No results found for: CKTOTAL, CKMB, CKMBINDEX, TROPONINI °Lab Results  °Component Value Date  ° CHOL 184 07/18/2019  ° CHOL 168 08/06/2017  ° °Lab Results  °Component Value Date  ° HDL 46 07/18/2019  ° HDL 54 08/06/2017  ° °Lab Results  °Component Value Date  ° LDLCALC 121 (H) 07/18/2019  ° LDLCALC 95 08/06/2017  ° °Lab Results  °Component Value Date  ° TRIG 87 07/18/2019  ° TRIG 96 08/06/2017  ° °Lab Results  °Component Value Date  ° CHOLHDL 4.0 07/18/2019  ° CHOLHDL 3.1 08/06/2017  ° °No results found for: LDLDIRECT  °No results found for: DDIMER °  °Radiology/Studies:  ° °Ct Code Stroke Cta Head W/wo Contrast °Result Date: 07/17/2019 °CLINICAL DATA:  Sudden onset of right upper extremity weakness. EXAM: CT ANGIOGRAPHY HEAD AND NECK CT PERFUSION BRAIN TECHNIQUE: Multidetector CT imaging of the head and neck was performed using the standard protocol during bolus administration of  intravenous contrast. Multiplanar CT image reconstructions and MIPs were obtained to evaluate the vascular anatomy. Carotid stenosis measurements (when applicable) are obtained utilizing NASCET criteria, using the distal internal carotid diameter as the denominator. Multiphase CT imaging of the brain was performed following IV bolus contrast injection. Subsequent parametric perfusion maps were calculated using RAPID software. CONTRAST:  90mL OMNIPAQUE IOHEXOL 350 MG/ML SOLN COMPARISON:  CT head without contrast of the same day. FINDINGS: CTA NECK FINDINGS Aortic arch: There is a common origin of the left common carotid artery and innominate artery. No significant vascular calcifications or stenosis are present at the aortic arch. Right carotid system: The right common carotid artery within normal limits. Right carotid bifurcation is unremarkable. Cervical right ICA is normal. Left carotid system: The left common carotid artery is within normal limits. Minimal atherosclerotic changes are present bifurcation. There is no significant stenosis. The cervical left ICA is normal. Vertebral arteries: The vertebral arteries originate from the subclavian arteries bilaterally. There is no significant stenosis at the origins. No focal stenosis or vascular injury is present in either vertebral artery in the neck. Skeleton: Cervical spine fusion is noted at C3-4, C4-5, and C5-6. Chronic endplate changes are present at C6-7 and C7-T1. Chronic endplate changes are present at C2-3. No focal lytic blastic lesions are present. Patient has 2 residual mandibular teeth, both with chronic periapical lucencies. Other neck: No focal mucosal or submucosal lesions are present. The salivary glands are within normal limits. Thyroid is normal. No significant adenopathy is present. Upper chest: Lung apices are clear without focal nodule, mass, or airspace disease. Thoracic inlet is within   normal limits. The upper mediastinum is unremarkable.  Review of the MIP images confirms the above findings CTA HEAD FINDINGS Anterior circulation: Minimal atherosclerotic changes are present within the scratched at the internal carotid arteries are within normal limits from the high cervical segments through the ICA termini bilaterally. The A1 and M1 segments are normal. The left A1 segment is dominant. MCA bifurcations are within normal limits. The MCA branch vessels are normal bilaterally. No significant stenosis or occlusion is present. There is mild irregularity of distal ACA branch vessels without a significant focal stenosis or occlusion. Posterior circulation: The vertebral arteries are codominant. PICA origins are visualized and normal. Basilar artery is normal. Both posterior cerebral arteries originate from the basilar tip. Small posterior communicating arteries are patent bilaterally. PCA vessels demonstrate mild distal narrowing without a significant proximal stenosis or occlusion. There is no aneurysm. Venous sinuses: The dural sinuses are patent. Right transverse sinus is dominant. Straight sinus deep cerebral veins are intact. Cortical veins are within normal limits bilaterally. Anatomic variants: None Review of the MIP images confirms the above findings CT Brain Perfusion Findings: ASPECTS: 10/10 CBF (<30%) Volume: 0mL Perfusion (Tmax>6.0s) volume: 17mL Mismatch Volume: 17mL Infarction Location:Distal left ACA territory. IMPRESSION: 1. Focal area of ischemia involving the distal left ACA territory. 2. No significant large vessel occlusion associated with the left ACA. 3. Extensive distal small vessel disease throughout the circle-of-Willis. 4. No significant atherosclerotic disease in the neck. 5. Cervical spine fusion with degenerative endplate changes above and below the fused levels. Electronically Signed   By: Christopher  Mattern M.D.   On: 07/17/2019 18:44  ° ° ° °Mr Brain Wo Contrast °Result Date: 07/18/2019 °CLINICAL DATA:  Follow-up examination  for acute stroke, status post tPA. EXAM: MRI HEAD WITHOUT CONTRAST TECHNIQUE: Multiplanar, multiecho pulse sequences of the brain and surrounding structures were obtained without intravenous contrast. COMPARISON:  Prior CT from 07/17/2019. FINDINGS: Brain: Cerebral volume within normal limits for age. Mild scattered patchy T2/FLAIR hyperintensity within the periventricular and deep white matter both cerebral hemispheres most consistent with chronic small vessel ischemic disease. Mild chronic microvascular ischemic changes present within the pons as well. Patchy focus of restricted diffusion seen involving the parasagittal anterior left frontal lobe, compatible with acute ischemic infarct (series 5, image 89). Additional patchy small volume cortical infarcts seen involving the high left parietal lobe (series 5, image 98). Punctate focus of ischemia present at the mesial posterior left temporal lobe (series 5, image 76). No associated hemorrhage or mass effect. No other evidence for acute or subacute ischemia. Gray-white matter differentiation otherwise maintained. No foci of susceptibility artifact to suggest acute or chronic intracranial hemorrhage. No mass lesion, midline shift or mass effect. No hydrocephalus. No extra-axial fluid collection. Pituitary gland suprasellar region within normal limits. Midline structures intact. Vascular: Major intracranial vascular flow voids are maintained. Skull and upper cervical spine: Craniocervical junction within normal limits. Postsurgical changes partially visualize within the upper cervical spine. Bone marrow signal intensity within normal limits. No scalp soft tissue abnormality. Sinuses/Orbits: Globes and orbital soft tissues within normal limits. Paranasal sinuses are clear. No mastoid effusion. Inner ear structures normal. Other: None. IMPRESSION: 1. Patchy small volume acute ischemic cortical infarcts involving the left frontal, parietal, and temporal lobes as above.  No associated hemorrhage or mass effect. 2. Underlying mild chronic microvascular ischemic disease. No other acute intracranial abnormality. Electronically Signed   By: Benjamin  McClintock M.D.   On: 07/18/2019 18:54  ° ° ° °Ct Head Code Stroke   Wo Contrast °Result Date: 07/17/2019 °CLINICAL DATA:  Code stroke. Sudden onset of right upper extremity weakness. EXAM: CT HEAD WITHOUT CONTRAST TECHNIQUE: Contiguous axial images were obtained from the base of the skull through the vertex without intravenous contrast. COMPARISON:  None. FINDINGS: Brain: No acute infarct, hemorrhage, or mass lesion is present. No significant white matter lesions are present. The ventricles are of normal size. No significant extraaxial fluid collection is present. The brainstem and cerebellum are within normal limits. Vascular: There is a possible hyperdense left MCA near the bifurcation. No significant vascular calcifications are present. Skull: Calvarium is intact. No focal lytic or blastic lesions are present. Sinuses/Orbits: The paranasal sinuses and mastoid air cells are clear. The globes and orbits are within normal limits. ASPECTS (Alberta Stroke Program Early CT Score) - Ganglionic level infarction (caudate, lentiform nuclei, internal capsule, insula, M1-M3 cortex): 7/7 - Supraganglionic infarction (M4-M6 cortex): 3/3 Total score (0-10 with 10 being normal): 10/10 IMPRESSION: 1. Possible hyperdense left MCA near the bifurcation. 2. No focal cortical abnormalities. 3. ASPECTS is 10/10 The above was relayed via text pager to Dr. Aroor on 07/17/2019 at 18:07 . Electronically Signed   By: Christopher  Mattern M.D.   On: 07/17/2019 18:07  ° ° ° °Vas Us Lower Extremity Venous (dvt) °Result Date: 07/18/2019 ° Lower Venous Study Indications: Stroke.  Comparison Study: No prior study noted for comparison Performing Technologist: Candace Kanady RVS  Examination Guidelines: A complete evaluation includes B-mode imaging, spectral Doppler, color  Doppler, and power Doppler as needed of all accessible portions of each vessel. Bilateral testing is considered an integral part of a complete examination. Limited examinations for reoccurring indications may be performed as noted.   Summary: Right: There is no evidence of deep vein thrombosis in the lower extremity. Left: There is no evidence of deep vein thrombosis in the lower extremity.  *See table(s) above for measurements and observations. Electronically signed by Christopher Dickson MD on 07/18/2019 at 7:10:43 PM.    Final   ° ° °12-lead ECG SR °All prior EKG's in EPIC reviewed with no documented atrial fibrillation ° °Telemetry SR ° °Assessment and Plan: ° °1. Cryptogenic stroke °The patient presents with cryptogenic stroke.  The patient has a TEE planned for this AM.  I spoke at length with the patient about monitoring for afib with either a 30 day event monitor or an implantable loop recorder.  Risks, benefits, and alteratives to implantable loop recorder were discussed with the patient today.   At this time, the patient is very clear in his decision to proceed with implantable loop recorder.  ° °Wound care was reviewed with the patient (keep incision clean and dry for 3 days).  Wound check will be scheduled for the patient ° °Please call with questions. ° ° °Renee Lynn Ursuy, PA-C °07/20/2019 ° °I have seen, examined the patient, and reviewed the above assessment and plan.  Changes to above are made where necessary.  On exam, RRR.   ° The patient presents with cryptogenic stroke. He has been monitored without afib observed. Risks, benefits, and alteratives to implantable loop recorder were discussed with the patient today.   At this time, the patient is very clear in their decision to proceed with implantable loop recorder.  °  °Co Sign: Rital Cavey, MD °07/20/2019 °4:43 PM ° ° °

## 2019-07-20 NOTE — Interval H&P Note (Signed)
History and Physical Interval Note:  07/20/2019 7:47 AM  Ricky Walter  has presented today for surgery, with the diagnosis of STROKE.  The various methods of treatment have been discussed with the patient and family. After consideration of risks, benefits and other options for treatment, the patient has consented to  Procedure(s): TRANSESOPHAGEAL ECHOCARDIOGRAM (TEE) (N/A) as a surgical intervention.  The patient's history has been reviewed, patient examined, no change in status, stable for surgery.  I have reviewed the patient's chart and labs.  Questions were answered to the patient's satisfaction.     Fransico Him

## 2019-07-20 NOTE — CV Procedure (Signed)
    PROCEDURE NOTE:  Procedure:  Transesophageal echocardiogram Operator:  Fransico Him, MD Indications:  CVA Complications: None  During this procedure the patient is administered a total of Versed 3 mg and Fentanyl 25 mg to achieve and maintain moderate conscious sedation.  The patient's heart rate, blood pressure, and oxygen saturation are monitored continuously during the procedure. The period of conscious sedation is 11 minutes, of which I was present face-to-face 100% of this time.  Results: Normal LV size and function Normal RV size and function Normal RA Normal LA and LA appendage with no evidence of thrombus and normal emptying velocity Normal TV with trivial TR Normal PV Normal MV with trivial MR Normal trileaflet AV Lipomatous interatrial septum with evidence of PFO by agitated saline contrast injection. Normal thoracic and ascending aorta.  The patient tolerated the procedure well and was transferred back to their room in stable condition.  Signed: Fransico Him, MD [facility]

## 2019-07-21 ENCOUNTER — Encounter (HOSPITAL_COMMUNITY): Payer: Self-pay | Admitting: Internal Medicine

## 2019-07-21 DIAGNOSIS — F1120 Opioid dependence, uncomplicated: Secondary | ICD-10-CM | POA: Diagnosis not present

## 2019-07-24 DIAGNOSIS — I10 Essential (primary) hypertension: Secondary | ICD-10-CM | POA: Diagnosis not present

## 2019-07-24 DIAGNOSIS — E785 Hyperlipidemia, unspecified: Secondary | ICD-10-CM | POA: Diagnosis not present

## 2019-07-24 DIAGNOSIS — E039 Hypothyroidism, unspecified: Secondary | ICD-10-CM | POA: Diagnosis not present

## 2019-07-24 DIAGNOSIS — F329 Major depressive disorder, single episode, unspecified: Secondary | ICD-10-CM | POA: Diagnosis not present

## 2019-07-24 DIAGNOSIS — B1920 Unspecified viral hepatitis C without hepatic coma: Secondary | ICD-10-CM | POA: Diagnosis not present

## 2019-07-24 DIAGNOSIS — E669 Obesity, unspecified: Secondary | ICD-10-CM | POA: Diagnosis not present

## 2019-07-24 DIAGNOSIS — I69341 Monoplegia of lower limb following cerebral infarction affecting right dominant side: Secondary | ICD-10-CM | POA: Diagnosis not present

## 2019-07-24 DIAGNOSIS — G8929 Other chronic pain: Secondary | ICD-10-CM | POA: Diagnosis not present

## 2019-07-24 DIAGNOSIS — M5441 Lumbago with sciatica, right side: Secondary | ICD-10-CM | POA: Diagnosis not present

## 2019-07-26 ENCOUNTER — Telehealth: Payer: Self-pay | Admitting: *Deleted

## 2019-07-26 NOTE — Telephone Encounter (Signed)
Cindee Salt from [employer] for PT verbal orders as follows:  3 time(s) weekly for 1 week(s), then 2 time(s) weekly for 2 week(s), then 1 time(s) weekly for 2 week(s)   You can leave verbal orders on confidential voicemail.  Christen Bame, CMA

## 2019-07-27 DIAGNOSIS — F1120 Opioid dependence, uncomplicated: Secondary | ICD-10-CM | POA: Diagnosis not present

## 2019-07-29 ENCOUNTER — Telehealth: Payer: Self-pay | Admitting: Family Medicine

## 2019-07-29 NOTE — Telephone Encounter (Signed)
Maggie from [facility] is calling requesting verbal orders for PT. Balance Gate and Transfer training:  1 week 1 2 week 3 1week 2    The best call back number is [phone].

## 2019-07-31 DIAGNOSIS — F1120 Opioid dependence, uncomplicated: Secondary | ICD-10-CM | POA: Diagnosis not present

## 2019-08-02 ENCOUNTER — Other Ambulatory Visit: Payer: Self-pay

## 2019-08-02 ENCOUNTER — Ambulatory Visit (INDEPENDENT_AMBULATORY_CARE_PROVIDER_SITE_OTHER): Payer: Medicare HMO | Admitting: Student

## 2019-08-02 DIAGNOSIS — E785 Hyperlipidemia, unspecified: Secondary | ICD-10-CM | POA: Diagnosis not present

## 2019-08-02 DIAGNOSIS — E669 Obesity, unspecified: Secondary | ICD-10-CM | POA: Diagnosis not present

## 2019-08-02 DIAGNOSIS — B1920 Unspecified viral hepatitis C without hepatic coma: Secondary | ICD-10-CM | POA: Diagnosis not present

## 2019-08-02 DIAGNOSIS — I69341 Monoplegia of lower limb following cerebral infarction affecting right dominant side: Secondary | ICD-10-CM | POA: Diagnosis not present

## 2019-08-02 DIAGNOSIS — M5441 Lumbago with sciatica, right side: Secondary | ICD-10-CM | POA: Diagnosis not present

## 2019-08-02 DIAGNOSIS — E039 Hypothyroidism, unspecified: Secondary | ICD-10-CM | POA: Diagnosis not present

## 2019-08-02 DIAGNOSIS — I639 Cerebral infarction, unspecified: Secondary | ICD-10-CM

## 2019-08-02 DIAGNOSIS — F329 Major depressive disorder, single episode, unspecified: Secondary | ICD-10-CM | POA: Diagnosis not present

## 2019-08-02 DIAGNOSIS — G8929 Other chronic pain: Secondary | ICD-10-CM | POA: Diagnosis not present

## 2019-08-02 DIAGNOSIS — I10 Essential (primary) hypertension: Secondary | ICD-10-CM | POA: Diagnosis not present

## 2019-08-02 LAB — CUP PACEART INCLINIC DEVICE CHECK
Date Time Interrogation Session: 20200810095447
Implantable Pulse Generator Implant Date: 20200728

## 2019-08-02 NOTE — Progress Notes (Signed)
ILR wound check in clinic. Steri strips previously removed. Wound well healed. Home monitor transmitting nightly. No episodes. Questions answered. R wave 0.51 mV.    [name] [address]" [place]ers, PA-C 08/02/2019 9:53 AM

## 2019-08-02 NOTE — Patient Instructions (Signed)
Medication Instructions:  Your physician recommends that you continue on your current medications as directed. Please refer to the Current Medication list given to you today.  If you need a refill on your cardiac medications before your next appointment, please call your pharmacy.   Lab work: NONE ORDERED  TODAY   If you have labs (blood work) drawn today and your tests are completely normal, you will receive your results only by: . MyChart Message (if you have MyChart) OR . A paper copy in the mail If you have any lab test that is abnormal or we need to change your treatment, we will call you to review the results.  Testing/Procedures: NONE ORDERED  TODAY   Follow-Up: At [facility], you and your health needs are our priority.  As part of our continuing mission to provide you with exceptional heart care, we have created designated Provider Care Teams.  These Care Teams include your primary Cardiologist (physician) and Advanced Practice Providers (APPs -  Physician Assistants and Nurse Practitioners) who all work together to provide you with the care you need, when you need it. CONTACT [facility] [phone] AS NEEDED FOR  ANY CARDIAC RELATED SYMPTOMS   Any Other Special Instructions Will Be Listed Below (If Applicable).    

## 2019-08-03 ENCOUNTER — Ambulatory Visit: Payer: Medicare HMO | Admitting: Family Medicine

## 2019-08-03 DIAGNOSIS — F1120 Opioid dependence, uncomplicated: Secondary | ICD-10-CM | POA: Diagnosis not present

## 2019-08-04 ENCOUNTER — Ambulatory Visit (INDEPENDENT_AMBULATORY_CARE_PROVIDER_SITE_OTHER): Payer: Medicare HMO | Admitting: Family Medicine

## 2019-08-04 ENCOUNTER — Ambulatory Visit: Payer: Medicare HMO | Admitting: Family Medicine

## 2019-08-04 ENCOUNTER — Other Ambulatory Visit: Payer: Self-pay

## 2019-08-04 VITALS — BP 119/80 | HR 89

## 2019-08-04 DIAGNOSIS — E038 Other specified hypothyroidism: Secondary | ICD-10-CM

## 2019-08-04 DIAGNOSIS — I63422 Cerebral infarction due to embolism of left anterior cerebral artery: Secondary | ICD-10-CM | POA: Diagnosis not present

## 2019-08-04 NOTE — Patient Instructions (Addendum)
It was great to see you today! Thank you for letting me participate in your care!  Today, we discussed your recent stroke and I am glad you have made a great recovery. Please continue taking Plavix for 21 days and then stop and Asprin indefinetly. Please also continue taking Atorvastatin 40mg  daily.  You are a prediabetic so please work on your diet and limit fatty foods, fried foods, and sweets, and sodas. Please attempt light to moderate exercise for 30 minutes per day 5 times per week.  I will follow up your labs in 3 months for your prediabetes and your thyroid.  Be well, Harolyn Rutherford, DO PGY-3, [facility]

## 2019-08-04 NOTE — Progress Notes (Signed)
Subjective: Chief Complaint  Patient presents with  . stroke follow up   HPI: Ricky Walter is a 63 y.o. presenting to clinic today to discuss the following:  F/u for stroke Patient presents to clinic today for stroke that occurred while he was taking his groceries inside that presented with total right sided weakness. He was taken to the ED, received tPA and was found to have a left sided several small acute ischemic cortical infarcts involving the frontal, parietal, and temporal lobes. He was also found to have a PFO although Cardiology and Neurology do not believe this contributed to his stroke. He has a implantable loop recorder placed and has follow up with Neurology. He has been undergoing PT and has been doing well although he still requires the use of a cane. He is a prediabetic with A1c of 5.8%, on atorvastatin 40mg , and on Plavix 75mg  and ASA 81mg  daily. He will continue Plavix for a total of 21 days and then go to ASA only.  Today he still endorses some subjective right sided weakness but states he is able to do all ADLs that he was doing before his stroke.    Hypothyroidism Patient had not been seen in our clinic in over one year. He was previously on 150mcg of synthroid and was discharged from the hospital on 200mcg due to elevated TSH and low normal T4 during his last admission.  ROS noted in HPI.   Past Medical, Surgical, Social, and Family History Reviewed & Updated per EMR.   Pertinent Historical Findings include:   Social History   Tobacco Use  Smoking Status Never Smoker  Smokeless Tobacco Never Used   Objective: BP 119/80   Pulse 89   SpO2 94%  Vitals and nursing notes reviewed  Physical Exam Gen: Alert and Oriented x 3, NAD HEENT: Normocephalic, atraumatic, PERRLA, EOMI Neck: no thyroidmegaly, no LAD CV: RRR, no murmurs, normal S1, S2 split Resp: CTAB, no wheezing, rales, or rhonchi, comfortable work of breathing Ext: no clubbing, cyanosis, or edema  Neuro: CN II-XII, gross sensation intact, 5/5 strength in UE and LE bilaterally, +2 patellar and achilles reflexes Skin: warm, dry, intact, no rashes  Assessment/Plan:  Stroke (cerebrum) (HCC) - patchy L ACA s/p tPA Plan will be to limit patient modifiable risk factors going forward by keeping cholesterol, blood glucose, blood pressure levels to goal. - BP well controlled today, no medication indicated at this time - Last lipid panel had elevated LDL; given age and history of stroke high intensity statin is recommended: Cont Atorvastatin 40mg  daily - A1c of 5.8% is in prediabetic range; discussed lifestyle modifications. Patient will attempt to limit fast food to once per month and stop sodas - F/u in 3 months  Hypothyroidism Patient had levothyroxine increased from 150mcg to 200mcg due to elevated TSH and low normal free T4. - Repeat TSH and free T4 in 3 months - Increase levothyroxine slowly given history of cardiac disease if needed at next appointment.   PATIENT EDUCATION PROVIDED: See AVS    Diagnosis and plan along with any newly prescribed medication(s) were discussed in detail with this patient today. The patient verbalized understanding and agreed with the plan. Patient advised if symptoms worsen return to clinic or ER.   Orders Placed This Encounter  Procedures  . TSH + free T4    Standing Status:   Future    Standing Expiration Date:   02/04/2020  . POCT glycosylated hemoglobin (Hb A1C)  Associate with Z13.1    Standing Status:   Future    Standing Expiration Date:   02/04/2020    Harolyn Rutherford, DO 08/04/2019, 4:35 PM PGY-3 [place]

## 2019-08-05 NOTE — Assessment & Plan Note (Signed)
Patient had levothyroxine increased from 148mcg to 268mcg due to elevated TSH and low normal free T4. - Repeat TSH and free T4 in 3 months - Increase levothyroxine slowly given history of cardiac disease if needed at next appointment.

## 2019-08-05 NOTE — Assessment & Plan Note (Signed)
Plan will be to limit patient modifiable risk factors going forward by keeping cholesterol, blood glucose, blood pressure levels to goal. - BP well controlled today, no medication indicated at this time - Last lipid panel had elevated LDL; given age and history of stroke high intensity statin is recommended: Cont Atorvastatin 40mg  daily - A1c of 5.8% is in prediabetic range; discussed lifestyle modifications. Patient will attempt to limit fast food to once per month and stop sodas - F/u in 3 months

## 2019-08-06 DIAGNOSIS — G8929 Other chronic pain: Secondary | ICD-10-CM | POA: Diagnosis not present

## 2019-08-06 DIAGNOSIS — E669 Obesity, unspecified: Secondary | ICD-10-CM | POA: Diagnosis not present

## 2019-08-06 DIAGNOSIS — I10 Essential (primary) hypertension: Secondary | ICD-10-CM | POA: Diagnosis not present

## 2019-08-06 DIAGNOSIS — I69341 Monoplegia of lower limb following cerebral infarction affecting right dominant side: Secondary | ICD-10-CM | POA: Diagnosis not present

## 2019-08-06 DIAGNOSIS — E039 Hypothyroidism, unspecified: Secondary | ICD-10-CM | POA: Diagnosis not present

## 2019-08-06 DIAGNOSIS — E785 Hyperlipidemia, unspecified: Secondary | ICD-10-CM | POA: Diagnosis not present

## 2019-08-06 DIAGNOSIS — F329 Major depressive disorder, single episode, unspecified: Secondary | ICD-10-CM | POA: Diagnosis not present

## 2019-08-06 DIAGNOSIS — B1920 Unspecified viral hepatitis C without hepatic coma: Secondary | ICD-10-CM | POA: Diagnosis not present

## 2019-08-06 DIAGNOSIS — M5441 Lumbago with sciatica, right side: Secondary | ICD-10-CM | POA: Diagnosis not present

## 2019-08-07 DIAGNOSIS — F1120 Opioid dependence, uncomplicated: Secondary | ICD-10-CM | POA: Diagnosis not present

## 2019-08-09 DIAGNOSIS — E039 Hypothyroidism, unspecified: Secondary | ICD-10-CM | POA: Diagnosis not present

## 2019-08-09 DIAGNOSIS — E785 Hyperlipidemia, unspecified: Secondary | ICD-10-CM | POA: Diagnosis not present

## 2019-08-09 DIAGNOSIS — F329 Major depressive disorder, single episode, unspecified: Secondary | ICD-10-CM | POA: Diagnosis not present

## 2019-08-09 DIAGNOSIS — B1920 Unspecified viral hepatitis C without hepatic coma: Secondary | ICD-10-CM | POA: Diagnosis not present

## 2019-08-09 DIAGNOSIS — I10 Essential (primary) hypertension: Secondary | ICD-10-CM | POA: Diagnosis not present

## 2019-08-09 DIAGNOSIS — M5441 Lumbago with sciatica, right side: Secondary | ICD-10-CM | POA: Diagnosis not present

## 2019-08-09 DIAGNOSIS — I69341 Monoplegia of lower limb following cerebral infarction affecting right dominant side: Secondary | ICD-10-CM | POA: Diagnosis not present

## 2019-08-09 DIAGNOSIS — G8929 Other chronic pain: Secondary | ICD-10-CM | POA: Diagnosis not present

## 2019-08-09 DIAGNOSIS — E669 Obesity, unspecified: Secondary | ICD-10-CM | POA: Diagnosis not present

## 2019-08-10 DIAGNOSIS — F1120 Opioid dependence, uncomplicated: Secondary | ICD-10-CM | POA: Diagnosis not present

## 2019-08-14 DIAGNOSIS — F1120 Opioid dependence, uncomplicated: Secondary | ICD-10-CM | POA: Diagnosis not present

## 2019-08-17 DIAGNOSIS — G8929 Other chronic pain: Secondary | ICD-10-CM | POA: Diagnosis not present

## 2019-08-17 DIAGNOSIS — F329 Major depressive disorder, single episode, unspecified: Secondary | ICD-10-CM | POA: Diagnosis not present

## 2019-08-17 DIAGNOSIS — E039 Hypothyroidism, unspecified: Secondary | ICD-10-CM | POA: Diagnosis not present

## 2019-08-17 DIAGNOSIS — I69341 Monoplegia of lower limb following cerebral infarction affecting right dominant side: Secondary | ICD-10-CM | POA: Diagnosis not present

## 2019-08-17 DIAGNOSIS — E785 Hyperlipidemia, unspecified: Secondary | ICD-10-CM | POA: Diagnosis not present

## 2019-08-17 DIAGNOSIS — I10 Essential (primary) hypertension: Secondary | ICD-10-CM | POA: Diagnosis not present

## 2019-08-17 DIAGNOSIS — E669 Obesity, unspecified: Secondary | ICD-10-CM | POA: Diagnosis not present

## 2019-08-17 DIAGNOSIS — B1920 Unspecified viral hepatitis C without hepatic coma: Secondary | ICD-10-CM | POA: Diagnosis not present

## 2019-08-17 DIAGNOSIS — M5441 Lumbago with sciatica, right side: Secondary | ICD-10-CM | POA: Diagnosis not present

## 2019-08-17 DIAGNOSIS — F1120 Opioid dependence, uncomplicated: Secondary | ICD-10-CM | POA: Diagnosis not present

## 2019-08-23 ENCOUNTER — Ambulatory Visit (INDEPENDENT_AMBULATORY_CARE_PROVIDER_SITE_OTHER): Payer: Medicare HMO | Admitting: *Deleted

## 2019-08-23 DIAGNOSIS — M5441 Lumbago with sciatica, right side: Secondary | ICD-10-CM | POA: Diagnosis not present

## 2019-08-23 DIAGNOSIS — E785 Hyperlipidemia, unspecified: Secondary | ICD-10-CM | POA: Diagnosis not present

## 2019-08-23 DIAGNOSIS — B1920 Unspecified viral hepatitis C without hepatic coma: Secondary | ICD-10-CM | POA: Diagnosis not present

## 2019-08-23 DIAGNOSIS — F329 Major depressive disorder, single episode, unspecified: Secondary | ICD-10-CM | POA: Diagnosis not present

## 2019-08-23 DIAGNOSIS — I639 Cerebral infarction, unspecified: Secondary | ICD-10-CM

## 2019-08-23 DIAGNOSIS — E669 Obesity, unspecified: Secondary | ICD-10-CM | POA: Diagnosis not present

## 2019-08-23 DIAGNOSIS — I69341 Monoplegia of lower limb following cerebral infarction affecting right dominant side: Secondary | ICD-10-CM | POA: Diagnosis not present

## 2019-08-23 DIAGNOSIS — I10 Essential (primary) hypertension: Secondary | ICD-10-CM | POA: Diagnosis not present

## 2019-08-23 DIAGNOSIS — E039 Hypothyroidism, unspecified: Secondary | ICD-10-CM | POA: Diagnosis not present

## 2019-08-23 DIAGNOSIS — G8929 Other chronic pain: Secondary | ICD-10-CM | POA: Diagnosis not present

## 2019-08-23 LAB — CUP PACEART REMOTE DEVICE CHECK
Date Time Interrogation Session: 20200830234009
Implantable Pulse Generator Implant Date: 20200728

## 2019-08-24 DIAGNOSIS — F1120 Opioid dependence, uncomplicated: Secondary | ICD-10-CM | POA: Diagnosis not present

## 2019-08-28 DIAGNOSIS — F1120 Opioid dependence, uncomplicated: Secondary | ICD-10-CM | POA: Diagnosis not present

## 2019-08-31 DIAGNOSIS — F1120 Opioid dependence, uncomplicated: Secondary | ICD-10-CM | POA: Diagnosis not present

## 2019-09-01 NOTE — Progress Notes (Signed)
Carelink Summary Report / Loop Recorder 

## 2019-09-02 ENCOUNTER — Inpatient Hospital Stay: Payer: Medicare HMO | Admitting: Adult Health

## 2019-09-02 ENCOUNTER — Telehealth: Payer: Self-pay

## 2019-09-02 NOTE — Telephone Encounter (Signed)
Patient was a no call/no show for their appointment today.   

## 2019-09-02 NOTE — Progress Notes (Deleted)
[facility] [address] [place] [address] [phone]       HOSPITAL FOLLOW UP NOTE  [name] Date of Birth:  1956-05-16 Medical Record Number:  [id number]   Reason for Referral:  hospital stroke follow up    CHIEF COMPLAINT:  No chief complaint on file.   HPI: Ricky Walter being seen today for in office hospital follow-up regarding cryptogenic small left ACA infarcts and one punctate infarct in the left MCA/PCA status post TPA on 07/17/2019.  History obtained from *** and chart review. Reviewed all radiology images and labs personally.  Ricky Walter an 63 y.o.malewith HTN, HLD, obesity, anxiety, arthritis with chronic back pain and thyroid dz who presented to [facility] ED on 07/17/2019 with sudden onset of right leg weakness while walking in his front yard which resulted in fall.  Per review of the ED note, upon initial evaluation emergently at the EMS bridge, pt had no movement in the right leg, mild neglect and sensory loss in left leg more than arm. NIHSS 7.  Neurology consulted with stroke work-up revealing small left ACA infarcts and one punctate infarct at left MCA/PCA status post TPA embolic pattern secondary to unclear source.  CTH showed no acute changes and negative for hemorrhage. CTA showed no LVO. CTP showed large perfusion deficit in the L ACA with no core.  He received IV TPA without complication.  MRI showed patchy left ACA infarcts and one punctate infarct at left MCA/PCA along with small vessel disease.  Lower extremity venous Dopplers negative for DVT.  2D echo showed EF of 50%.  TEE showed lipomatous septum and PFO but no cardiac source of embolus identified.  Loop recorder placed for long-term cardioembolic monitoring.  He was not felt to be candidate for PFO closure with rope score 5 indicating likely not related to recent stroke.  LDL 21.  A1c 5.8.  HIV and UDS negative.  Recommended DAPT for 3 weeks and aspirin alone.  HTN stable.   Initiated atorvastatin 40 mg daily for HLD management.  Prior history of hypothyroidism with TSH 23.0111 with adjustment to Synthroid dosage and ongoing follow-up with PCP.  Other stroke risk factors include advanced age, obesity and family history of stroke but no prior personal history of stroke.  He was discharged home in stable condition on 07/20/2019 with recommendation of home health PT/OT/ST.  Ricky Walter is being seen today for hospital follow-up regarding recent stroke.  Residual deficits ***.  Completed 3 weeks DAPT and continues on aspirin alone without bleeding or bruising.  Continues on atorvastatin without side effects of myalgias.  Blood pressure today ***.  Loop recorder is not shown atrial fibrillation thus far.  Denies new or worsening stroke/TIA symptoms.   ROS:   14 system review of systems performed and negative with exception of ***  PMH:  Past Medical History:  Diagnosis Date   Anxiety    Arthritis    Chronic back pain 08/07/2015   Hyperlipidemia    Hypertension    Thyroid disease     PSH:  Past Surgical History:  Procedure Laterality Date   ANTERIOR FUSION CERVICAL SPINE     BUBBLE STUDY  07/20/2019   Procedure: BUBBLE STUDY;  Surgeon: Sueanne Margarita, MD;  Location: [facility];  Service: Cardiovascular;;   LOOP RECORDER INSERTION N/A 07/20/2019   Procedure: LOOP RECORDER INSERTION;  Surgeon: [name], MD;  Location: [facility];  Service: Cardiovascular;  Laterality: N/A;  TEE WITHOUT CARDIOVERSION N/A 07/20/2019   Procedure: TRANSESOPHAGEAL ECHOCARDIOGRAM (TEE);  Surgeon: Quintella Reicherturner, Traci R, MD;  [facility];  Service: Cardiovascular;  Laterality: N/A;    Social History:  Social History   Socioeconomic History   Marital status: Divorced    Spouse name: Not on file   Number of children: Not on file   Years of education: Not on file   Highest education level: Not on file  Occupational History   Not on file  Social Needs     Financial resource strain: Not on file   Food insecurity    Worry: Not on file    Inability: Not on file   Transportation needs    Medical: Not on file    Non-medical: Not on file  Tobacco Use   Smoking status: Never Smoker   Smokeless tobacco: Never Used  Substance and Sexual Activity   Alcohol use: No   Drug use: No   Sexual activity: Yes    Birth control/protection: None  Lifestyle   Physical activity    Days per week: Not on file    Minutes per session: Not on file   Stress: Not on file  Relationships   Social connections    Talks on phone: Not on file    Gets together: Not on file    Attends religious service: Not on file    Active member of club or organization: Not on file    Attends meetings of clubs or organizations: Not on file    Relationship status: Not on file   Intimate partner violence    Fear of current or ex partner: Not on file    Emotionally abused: Not on file    Physically abused: Not on file    Forced sexual activity: Not on file  Other Topics Concern   Not on file  Social History Narrative   Not on file    Family History:  Family History  Problem Relation Age of Onset   Hyperlipidemia Mother    Hypertension Mother    Thyroid disease Mother    Hyperlipidemia Father    Hypertension Father    Stroke Father     Medications:   Current Outpatient Medications on File Prior to Visit  Medication Sig Dispense Refill   acetaminophen (TYLENOL) 325 MG suppository Place 1 suppository (325 mg total) rectally 3 (three) times daily.     amitriptyline (ELAVIL) 10 MG tablet TAKE 1 TABLET (10 MG TOTAL) BY MOUTH AT BEDTIME. 90 tablet 3   aspirin EC 81 MG EC tablet Take 1 tablet (81 mg total) by mouth daily.     atorvastatin (LIPITOR) 40 MG tablet Take 1 tablet (40 mg total) by mouth daily at 6 PM. 30 tablet 2   gabapentin (NEURONTIN) 100 MG capsule Take 1 capsule (100 mg total) by mouth as needed (pain).     levothyroxine  (SYNTHROID) 200 MCG tablet Take 1 tablet (200 mcg total) by mouth daily. 30 tablet 2   methadone (DOLOPHINE) 10 MG/ML solution Take 60 mg by mouth daily.     No current facility-administered medications on file prior to visit.     Allergies:   Allergies  Allergen Reactions   Duloxetine     Nausea and drowsy.     Physical Exam  There were no vitals filed for this visit. There is no height or weight on file to calculate BMI. No exam data present  Dep[facility] screen [facility]/2019  Decreased Interest 0  Down, Depressed, Hopeless 0  PHQ - 2 Score 0  Altered sleeping -  Tired, decreased energy -  Change in appetite -  Feeling bad or failure about yourself  -  Trouble concentrating -  Moving slowly or fidgety/restless -  Suicidal thoughts -  PHQ-9 Score -  Difficult doing work/chores -     General: well developed, well nourished, seated, in no evident distress Head: head normocephalic and atraumatic.   Neck: supple with no carotid or supraclavicular bruits Cardiovascular: regular rate and rhythm, no murmurs Musculoskeletal: no deformity Skin:  no rash/petichiae Vascular:  Normal pulses all extremities   Neurologic Exam Mental Status: Awake and fully alert. Oriented to place and time. Recent and remote memory intact. Attention span, concentration and fund of knowledge appropriate. Mood and affect appropriate.  Cranial Nerves: Fundoscopic exam reveals sharp disc margins. Pupils equal, briskly reactive to light. Extraocular movements full without nystagmus. Visual fields full to confrontation. Hearing intact. Facial sensation intact. Face, tongue, palate moves normally and symmetrically.  Motor: Normal bulk and tone. Normal strength in all tested extremity muscles. Sensory.: intact to touch , pinprick , position and vibratory sensation.  Coordination: Rapid alternating movements normal in all extremities. Finger-to-nose and heel-to-shin performed accurately  bilaterally. Gait and Station: Arises from chair without difficulty. Stance is normal. Gait demonstrates normal stride length and balance Reflexes: 1+ and symmetric. Toes downgoing.     NIHSS  *** Modified Rankin  ***   Diagnostic Data (Labs, Imaging, Testing)  Ct Head Code Stroke Wo Contrast 07/17/2019 1.Possible hyperdense left MCA near the bifurcation. 2. No focal cortical abnormalities.  3. ASPECTS is 10/10   Ct Code Stroke Cta Head W/wo Contrast Ct Code Stroke Cta Neck W/wo Contrast Ct Code Stroke Cta Cerebral Perfusion W/wo Contrast 07/17/2019 1. Focal area of ischemia involving the distal left ACA territory.  2. No significant large vessel occlusion associated with the left ACA.  3. Extensive distal small vessel disease throughout the circle-of-Willis.  4. No significant atherosclerotic disease in the neck.  5. Cervical spine fusion with degenerative endplate changes above and below the fused levels.  Mr Brain Wo Contrast 07/18/2019 1. Patchy small volume acute ischemic cortical infarcts involving the left frontal, parietal, and temporal lobes as above. No associated hemorrhage or mass effect. 2. Underlying mild chronic microvascular ischemic disease. No other acute intracranial abnormality.  Vas [place] Lower Extremity Venous (dvt) 07/18/2019 Right: There is no evidence of deep vein thrombosis in the lower extremity.  Left: There is no evidence of deep vein thrombosis in the lower extremity.   Transthoracic Echocardiogram  1. The left ventricle has a visually estimated ejection fraction of approximately 50%. The cavity size was normal. There is mildly increased left ventricular wall thickness. Left ventricular diastolic Doppler parameters are consistent with impaired  relaxation. 2. The right ventricle has normal systolic function. The cavity was normal. There is no increase in right ventricular wall thickness. Right ventricular systolic pressure could not be  assessed. 3. The aortic valve is tricuspid. Mild aortic annular calcification noted. 4. The mitral valve is grossly normal. Mild calcification of the mitral valve leaflet. There is mild mitral annular calcification present. 5. The tricuspid valve is grossly normal. 6. The aorta is normal in size and structure.  TEE 07/20/2019 Normal LV size and function Normal RV size and function Normal RA Normal LAand LA appendage with no evidence of thrombus and normal emptying velocity Normal TVwith trivial TR Normal PV Normal MVwith trivial MR Normal trileaflet  AV Lipomatous interatrial septum with evidence of PFO by agitated saline contrast injection. Normal thoracic and ascending aorta  ECG - SR rate 83 BPM. (See cardiology reading for complete details)      ASSESSMENT: Ricky Walter is a 63 y.o. year old male presented with right leg weakness resulting in a fall on 07/17/2019 with stroke work-up revealing small left ACA infarcts and one punctate infarct at left MCA/PCA status post TPA secondary to unclear source. Vascular risk factors include HTN, HLD, obesity.     PLAN:  1. Cryptogenic stroke: Continue aspirin 81 mg daily  and atorvastatin for secondary stroke prevention.  Ongoing monitoring of loop recorder for potential atrial fibrillation.  Maintain strict control of hypertension with blood pressure goal below 130/90, diabetes with hemoglobin A1c goal below 6.5% and cholesterol with LDL cholesterol (bad cholesterol) goal below 70 mg/dL.  I also advised the patient to eat a healthy diet with plenty of whole grains, cereals, fruits and vegetables, exercise regularly with at least 30 minutes of continuous activity daily and maintain ideal body weight.  2. HTN: Advised to continue current treatment regimen.  Today's BP ***.  Advised to continue to monitor at home along with continued follow-up with PCP for management 3. HLD: Advised to continue current treatment regimen along with  continued follow-up with PCP for future prescribing and monitoring of lipid panel     Follow up in *** or call earlier if needed   Greater than 50% of time during this 45 minute visit was spent on counseling, explanation of diagnosis of cryptogenic stroke, reviewing risk factor management of HTN and HLD, planning of further management along with potential future management, and discussion with patient and family answering all questions.    Ihor AustinJessica McCue, AGNP-BC  [facility] [address] [place], [place] [id number]  Phone [phone] Fax [phone] Note: This document was prepared with digital dictation and possible smart phrase technology. Any transcriptional errors that result from this process are unintentional.

## 2019-09-04 DIAGNOSIS — F1120 Opioid dependence, uncomplicated: Secondary | ICD-10-CM | POA: Diagnosis not present

## 2019-09-07 DIAGNOSIS — F1120 Opioid dependence, uncomplicated: Secondary | ICD-10-CM | POA: Diagnosis not present

## 2019-09-11 DIAGNOSIS — F1120 Opioid dependence, uncomplicated: Secondary | ICD-10-CM | POA: Diagnosis not present

## 2019-09-14 DIAGNOSIS — F1120 Opioid dependence, uncomplicated: Secondary | ICD-10-CM | POA: Diagnosis not present

## 2019-09-21 DIAGNOSIS — F1120 Opioid dependence, uncomplicated: Secondary | ICD-10-CM | POA: Diagnosis not present

## 2019-09-23 DIAGNOSIS — F1120 Opioid dependence, uncomplicated: Secondary | ICD-10-CM | POA: Diagnosis not present

## 2019-09-24 ENCOUNTER — Ambulatory Visit (INDEPENDENT_AMBULATORY_CARE_PROVIDER_SITE_OTHER): Payer: Medicare HMO | Admitting: *Deleted

## 2019-09-24 DIAGNOSIS — I639 Cerebral infarction, unspecified: Secondary | ICD-10-CM

## 2019-09-25 DIAGNOSIS — F1120 Opioid dependence, uncomplicated: Secondary | ICD-10-CM | POA: Diagnosis not present

## 2019-09-25 LAB — CUP PACEART REMOTE DEVICE CHECK
Date Time Interrogation Session: 20201002234754
Implantable Pulse Generator Implant Date: 20200728

## 2019-09-28 DIAGNOSIS — F1120 Opioid dependence, uncomplicated: Secondary | ICD-10-CM | POA: Diagnosis not present

## 2019-09-28 NOTE — Progress Notes (Signed)
Carelink Summary Report / Loop Recorder 

## 2019-10-02 DIAGNOSIS — F1120 Opioid dependence, uncomplicated: Secondary | ICD-10-CM | POA: Diagnosis not present

## 2019-10-05 DIAGNOSIS — F1120 Opioid dependence, uncomplicated: Secondary | ICD-10-CM | POA: Diagnosis not present

## 2019-10-09 DIAGNOSIS — F1120 Opioid dependence, uncomplicated: Secondary | ICD-10-CM | POA: Diagnosis not present

## 2019-10-13 DIAGNOSIS — F1120 Opioid dependence, uncomplicated: Secondary | ICD-10-CM | POA: Diagnosis not present

## 2019-10-14 ENCOUNTER — Other Ambulatory Visit: Payer: Self-pay | Admitting: Family Medicine

## 2019-10-14 DIAGNOSIS — M5441 Lumbago with sciatica, right side: Secondary | ICD-10-CM

## 2019-10-14 DIAGNOSIS — G8929 Other chronic pain: Secondary | ICD-10-CM

## 2019-10-20 ENCOUNTER — Other Ambulatory Visit: Payer: Self-pay

## 2019-10-20 DIAGNOSIS — F1120 Opioid dependence, uncomplicated: Secondary | ICD-10-CM | POA: Diagnosis not present

## 2019-10-20 NOTE — Patient Outreach (Signed)
First attempt to obtain mRs. No answer. Left message for return call.  

## 2019-10-25 IMAGING — CT CT HEAD CODE STROKE W/O CM
4 series · 15 of 47 positions shown, 17 images · non-contrast
Comparison: None.

CLINICAL DATA: Code stroke. Sudden onset of right upper extremity
weakness.

EXAM:
CT HEAD WITHOUT CONTRAST
TECHNIQUE: Contiguous axial images were obtained from the base of the skull
through the vertex without intravenous contrast.

[Series 3: head wo · axial · 0.48mm/px · z∈[-137,-17]mm · 7 of 32 slices shown, 9 images]
[im 4/32  brain]
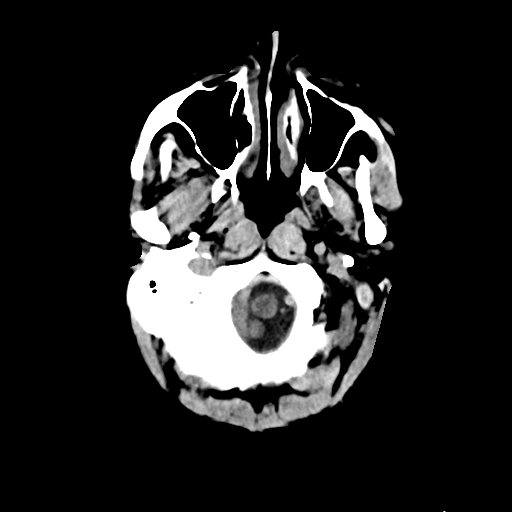
[im 4/32  bone]
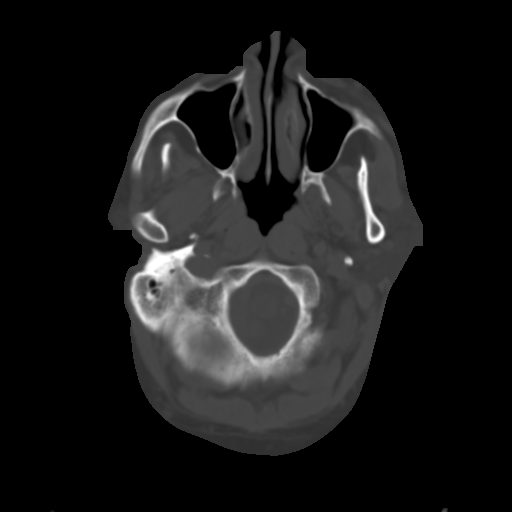
[im 8/32  brain]
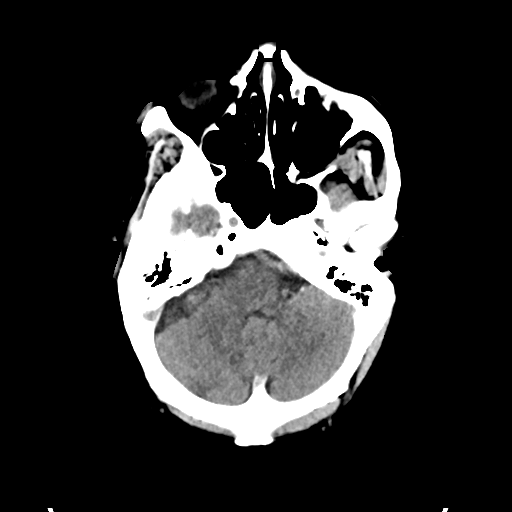
[im 12/32  brain]
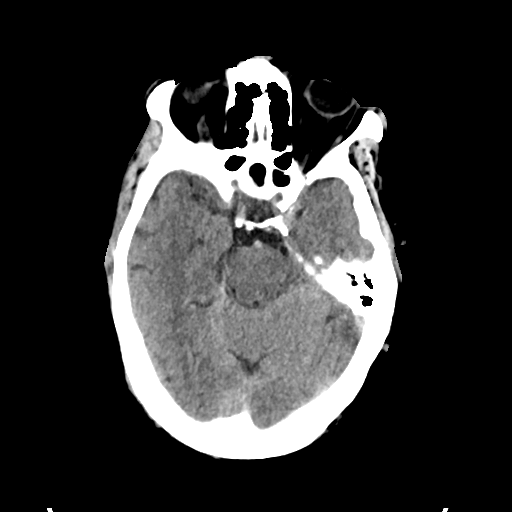
[im 16/32  brain]
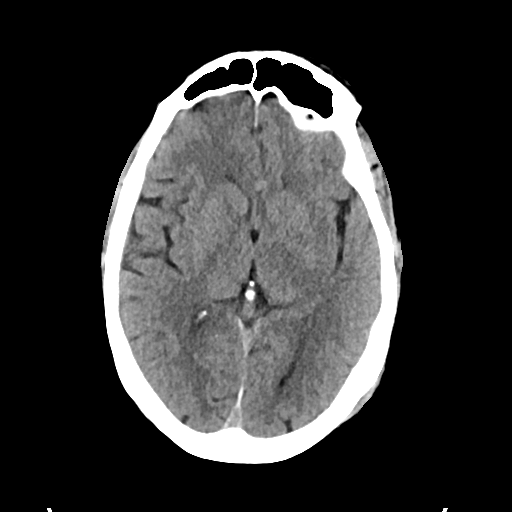
[im 20/32  brain]
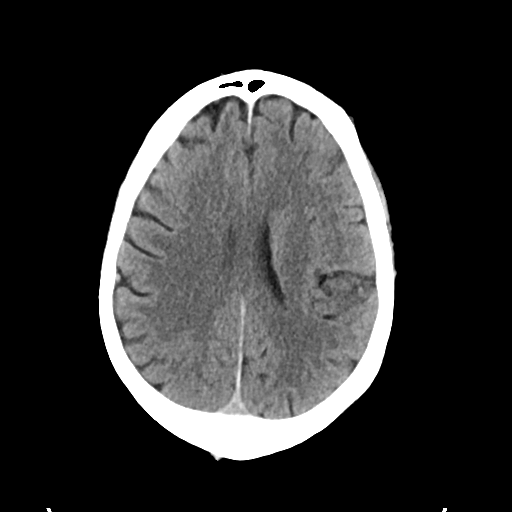
[im 20/32  bone]
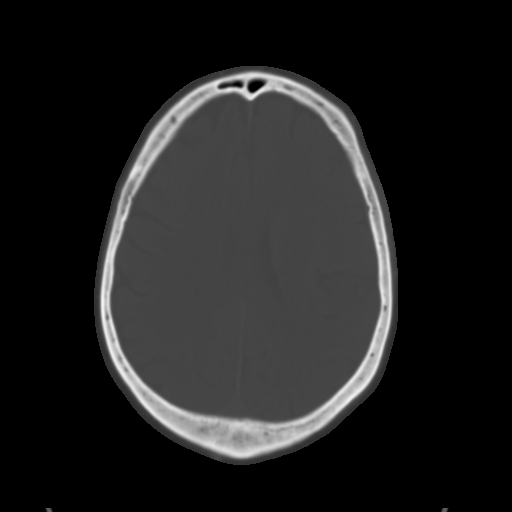
[im 24/32  brain]
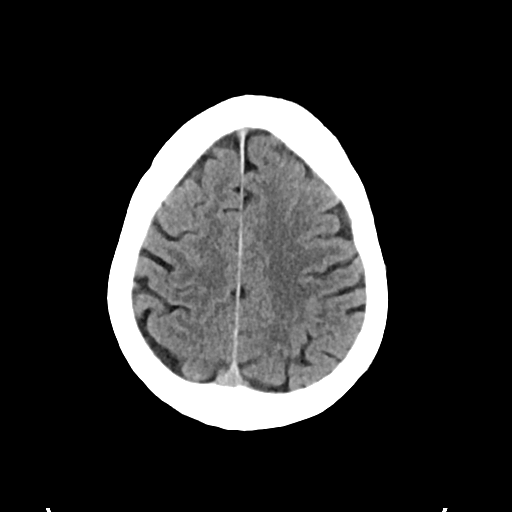
[im 28/32  brain]
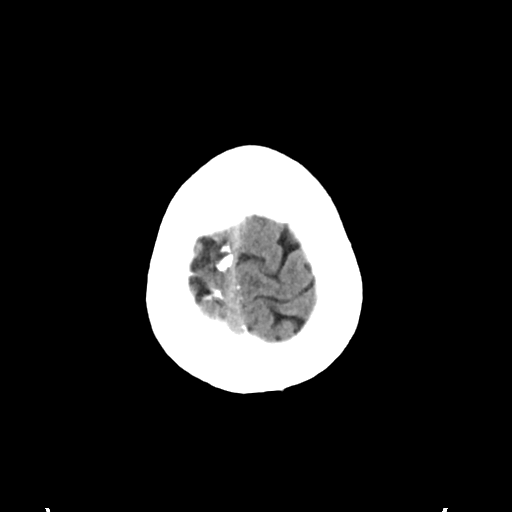

[Series 4: head bone · axial · 0.48mm/px · z∈[-138,-122]mm · 2 of 80 slices shown]
[im 8/80  bone]
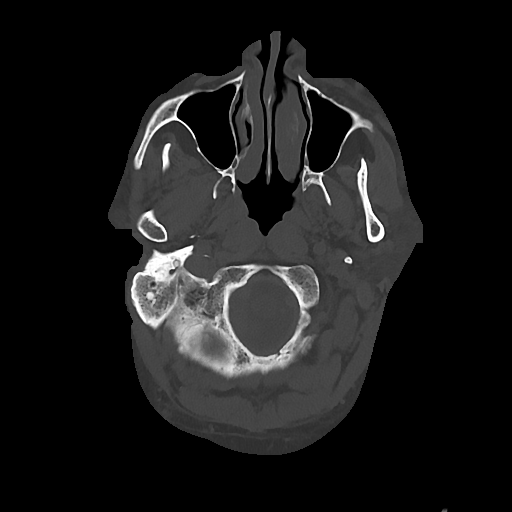
[im 16/80  bone]
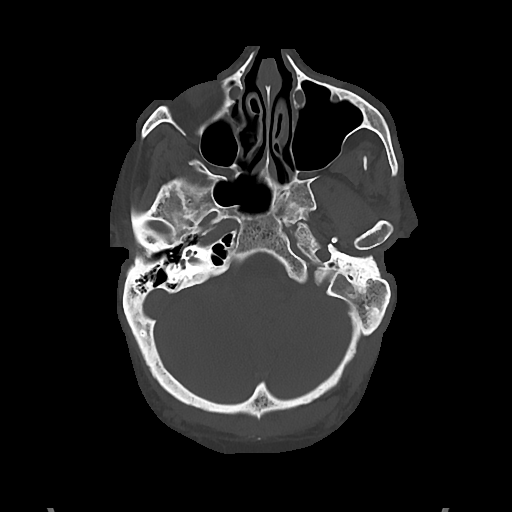

[Series 5: cor soft · coronal · 0.36mm/px · 3 of 73 slices shown]
[im 25/73  brain]
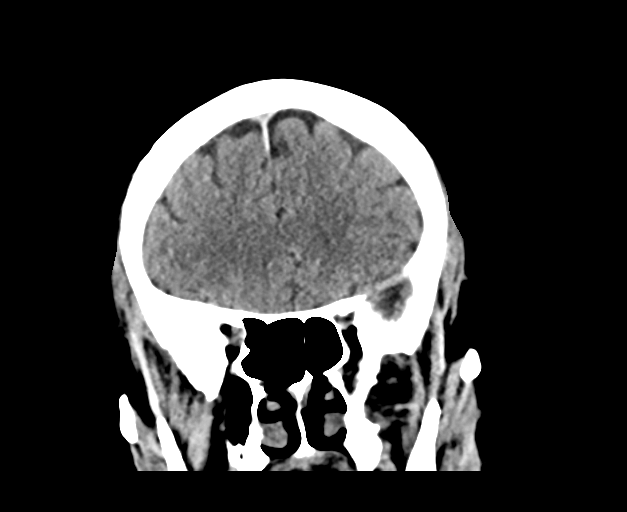
[im 33/73  brain]
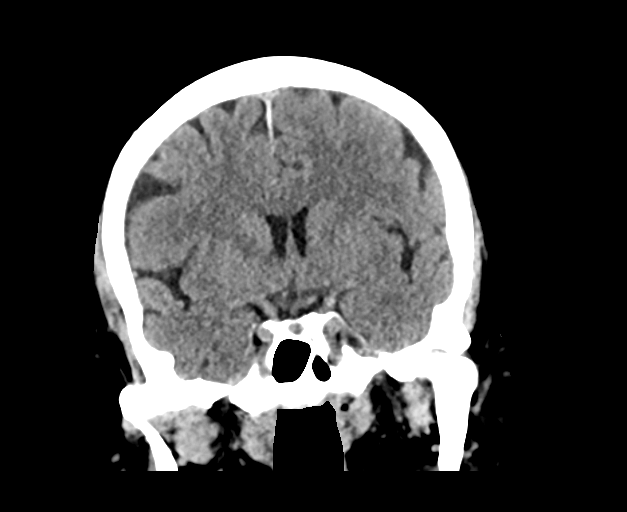
[im 41/73  brain]
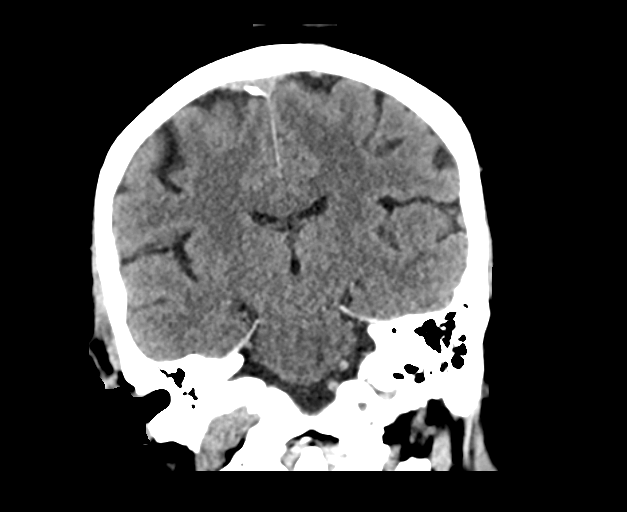

[Series 6: sag soft · sagittal · 0.37mm/px · 3 of 55 slices shown]
[im 19/55  brain]
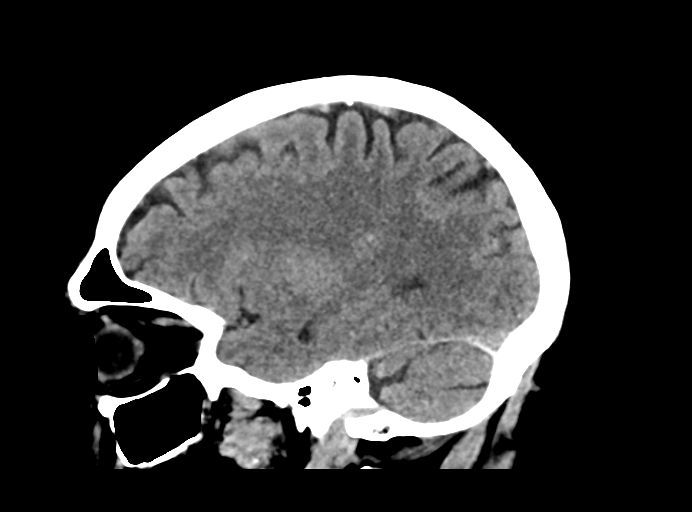
[im 28/55  brain]
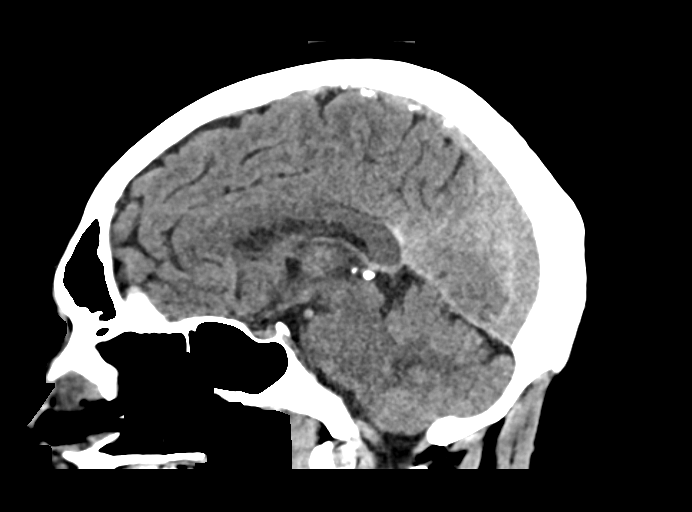
[im 37/55  brain]
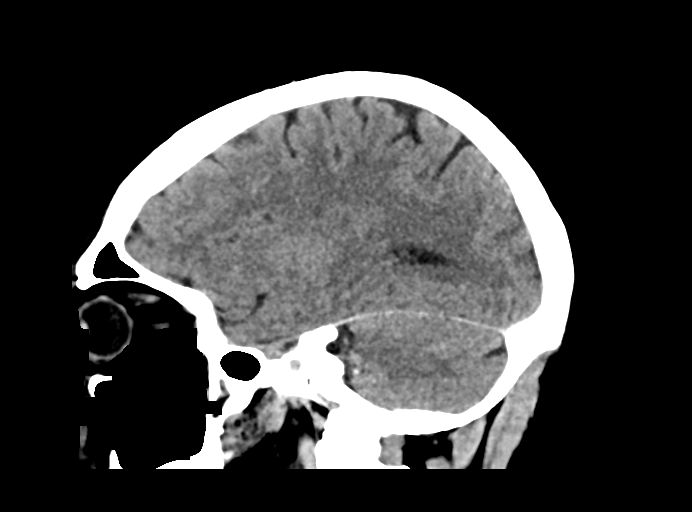

[15 of 47 positions shown; findings below may reference images not displayed]

FINDINGS: Brain: No acute infarct, hemorrhage, or mass lesion is present. No
significant white matter lesions are present. The ventricles are of
normal size. No significant extraaxial fluid collection is present.
The brainstem and cerebellum are within normal limits.

Vascular: There is a possible hyperdense left MCA near the
bifurcation. No significant vascular calcifications are present.

Skull: Calvarium is intact. No focal lytic or blastic lesions are
present.

Sinuses/Orbits: The paranasal sinuses and mastoid air cells are
clear. The globes and orbits are within normal limits.

ASPECTS ([place] Stroke Program Early CT Score)

- Ganglionic level infarction (caudate, lentiform nuclei, internal
capsule, insula, M1-M3 cortex): [DATE]

- Supraganglionic infarction (M4-M6 cortex): [DATE]

Total score (0-10 with 10 being normal): [DATE]
IMPRESSION: 1. Possible hyperdense left MCA near the bifurcation.
2. No focal cortical abnormalities.
3. ASPECTS is [DATE]

The above was relayed via text pager to Dr. Mboni on 07/17/2019 at

## 2019-10-26 ENCOUNTER — Other Ambulatory Visit: Payer: Self-pay

## 2019-10-26 NOTE — Patient Outreach (Signed)
Telephone outreach to patient to obtain mRs was successfully completed. mRs= 0. 

## 2019-10-27 ENCOUNTER — Ambulatory Visit (INDEPENDENT_AMBULATORY_CARE_PROVIDER_SITE_OTHER): Payer: Medicare HMO | Admitting: *Deleted

## 2019-10-27 DIAGNOSIS — I63422 Cerebral infarction due to embolism of left anterior cerebral artery: Secondary | ICD-10-CM

## 2019-10-27 DIAGNOSIS — F1120 Opioid dependence, uncomplicated: Secondary | ICD-10-CM | POA: Diagnosis not present

## 2019-10-28 LAB — CUP PACEART REMOTE DEVICE CHECK
Date Time Interrogation Session: 20201104234712
Implantable Pulse Generator Implant Date: 20200728

## 2019-10-29 DIAGNOSIS — F1120 Opioid dependence, uncomplicated: Secondary | ICD-10-CM | POA: Diagnosis not present

## 2019-10-30 DIAGNOSIS — F1120 Opioid dependence, uncomplicated: Secondary | ICD-10-CM | POA: Diagnosis not present

## 2019-11-03 DIAGNOSIS — F1120 Opioid dependence, uncomplicated: Secondary | ICD-10-CM | POA: Diagnosis not present

## 2019-11-06 DIAGNOSIS — F1120 Opioid dependence, uncomplicated: Secondary | ICD-10-CM | POA: Diagnosis not present

## 2019-11-10 DIAGNOSIS — F1120 Opioid dependence, uncomplicated: Secondary | ICD-10-CM | POA: Diagnosis not present

## 2019-11-13 DIAGNOSIS — F1120 Opioid dependence, uncomplicated: Secondary | ICD-10-CM | POA: Diagnosis not present

## 2019-11-14 NOTE — Progress Notes (Signed)
Carelink Summary Report / Loop Recorder 

## 2019-11-17 DIAGNOSIS — F1120 Opioid dependence, uncomplicated: Secondary | ICD-10-CM | POA: Diagnosis not present

## 2019-11-24 DIAGNOSIS — F1120 Opioid dependence, uncomplicated: Secondary | ICD-10-CM | POA: Diagnosis not present

## 2019-11-27 DIAGNOSIS — F1120 Opioid dependence, uncomplicated: Secondary | ICD-10-CM | POA: Diagnosis not present

## 2019-11-29 ENCOUNTER — Ambulatory Visit (INDEPENDENT_AMBULATORY_CARE_PROVIDER_SITE_OTHER): Payer: Medicare HMO | Admitting: *Deleted

## 2019-11-29 DIAGNOSIS — I639 Cerebral infarction, unspecified: Secondary | ICD-10-CM

## 2019-11-30 LAB — CUP PACEART REMOTE DEVICE CHECK
Date Time Interrogation Session: 20201208123228
Implantable Pulse Generator Implant Date: 20200728

## 2019-12-01 ENCOUNTER — Other Ambulatory Visit: Payer: Self-pay

## 2019-12-01 ENCOUNTER — Encounter: Payer: Self-pay | Admitting: Family Medicine

## 2019-12-01 ENCOUNTER — Ambulatory Visit (INDEPENDENT_AMBULATORY_CARE_PROVIDER_SITE_OTHER): Payer: Medicare HMO | Admitting: Family Medicine

## 2019-12-01 VITALS — BP 122/82 | HR 86 | Wt 269.2 lb

## 2019-12-01 DIAGNOSIS — I10 Essential (primary) hypertension: Secondary | ICD-10-CM | POA: Diagnosis not present

## 2019-12-01 DIAGNOSIS — G8929 Other chronic pain: Secondary | ICD-10-CM

## 2019-12-01 DIAGNOSIS — E785 Hyperlipidemia, unspecified: Secondary | ICD-10-CM | POA: Diagnosis not present

## 2019-12-01 DIAGNOSIS — E039 Hypothyroidism, unspecified: Secondary | ICD-10-CM

## 2019-12-01 DIAGNOSIS — M5441 Lumbago with sciatica, right side: Secondary | ICD-10-CM | POA: Diagnosis not present

## 2019-12-01 DIAGNOSIS — F1120 Opioid dependence, uncomplicated: Secondary | ICD-10-CM | POA: Diagnosis not present

## 2019-12-01 MED ORDER — ASPIRIN 81 MG PO TBEC: 81.0000 mg | DELAYED_RELEASE_TABLET | Freq: Every day | ORAL | Status: DC

## 2019-12-01 MED ORDER — LEVOTHYROXINE SODIUM 200 MCG PO TABS: 200.0000 ug | ORAL_TABLET | Freq: Every day | ORAL | 2 refills | Status: DC

## 2019-12-01 MED ORDER — AMITRIPTYLINE HCL 10 MG PO TABS: 10.0000 mg | ORAL_TABLET | Freq: Every day | ORAL | 3 refills | Status: DC

## 2019-12-01 MED ORDER — GABAPENTIN 100 MG PO CAPS: ORAL_CAPSULE | ORAL | 0 refills | Status: DC

## 2019-12-01 MED ORDER — ATORVASTATIN CALCIUM 40 MG PO TABS: 40.0000 mg | ORAL_TABLET | Freq: Every day | ORAL | 2 refills | Status: DC

## 2019-12-01 NOTE — Progress Notes (Signed)
  Patient Name: Ricky Walter Date of Birth: January 18, 1956 Date of Visit: 12/01/19 PCP: Nuala Alpha, DO  Chief Complaint: Prescription refills  Subjective: Ricky Walter is a 63 y.o. with medical history significant for HTN, stroke, hypothyroidism presenting today for follow up for hypothyroidism.  Hypothyroidism  Ricky Walter states he has been doing well and just needs refills for his medications.  Patient reports some occasional constipation that he addresses by eating ice cream and is then able to have a normal bowel movement.  Patient denies any palpitations, chest pain, difficulty breathing, dizziness, fatigue, or depressive symptoms.  Ricky Walter states that he has no difficulty taking his medications and is not experiencing any adverse side effects.  Discussed the patient's last visit and plan to check thyroid hormone levels to which the patient requested to schedule a separate lab appointment in the future.  Hypertension Patient with blood pressure within goal range 122/82.  Patient denies any chest pain or headache.  Patient adds that he has been experiencing back pain and walks with a limp.  Patient agrees to schedule an appointment to focus on this problem in the future.  Patient states that he will call for an appointment.  I have reviewed the patient's medical, surgical, family, and social history as appropriate.  Vitals:   12/01/19 1451  BP: 122/82  Pulse: 86  SpO2: 98%    Physical Exam:   General: Alert and cooperative and appears to be in no acute distress HEENT: Neck non-tender without lymphadenopathy, surgical incision scar on right anterior neck, enlargement palpated on right anterior neck, nontender to palpation Cardio: Normal S1 and S2, no S3 or S4. Rhythm is regular. No murmurs or rubs.   Pulm: Clear to auscultation bilaterally, no crackles, wheezing, or diminished breath sounds. Normal respiratory effort Abdomen: Obese abdomen with mild distention without pain to  palpation bowel sounds normal.   Extremities: No peripheral edema. Warm/ well perfused.  Neuro: alert and oriented x 3, patient ambulates with a limp  Assessment & Plan:   Essential hypertension Controlled.  122/82 in office today.  Red flag symptoms reviewed and negative. Continue with lifestyle diet and physical activity control blood pressure  Hypothyroidism Patient currently on 200 mcg of Synthroid.  Dose was increased in August 2020.  Patient denies any difficulty obtaining or taking prescription. -We will measure TSH and free T4 at lab appointment on 12/08/2019 -Continue Synthroid 200 mcg   Return to care at your earliest availability for follow-up of back pain.   Stark Klein, MD  Family Medicine  PGY1

## 2019-12-01 NOTE — Patient Instructions (Addendum)
Thank you for allowing [place] to take part in your care today.   You are scheduled to come back for a visit to check your thyroid hormone levels on 12/08/19 at 9:15AM. Please continue to take your medications as prescribed.   Please call our office to schedule an appointment so that we can fully focus on your back pain and trouble with walking.   I have sent in refills for you prescriptions as requested.    Best Wishes,   Dr. Rosita Fire

## 2019-12-01 NOTE — Assessment & Plan Note (Signed)
Controlled.  122/82 in office today.  Red flag symptoms reviewed and negative. Continue with lifestyle diet and physical activity control blood pressure

## 2019-12-01 NOTE — Assessment & Plan Note (Signed)
Patient currently on 200 mcg of Synthroid.  Dose was increased in August 2020.  Patient denies any difficulty obtaining or taking prescription. -We will measure TSH and free T4 at lab appointment on 12/08/2019 -Continue Synthroid 200 mcg

## 2019-12-04 DIAGNOSIS — F1120 Opioid dependence, uncomplicated: Secondary | ICD-10-CM | POA: Diagnosis not present

## 2019-12-08 ENCOUNTER — Other Ambulatory Visit: Payer: Medicare HMO

## 2019-12-08 DIAGNOSIS — B182 Chronic viral hepatitis C: Secondary | ICD-10-CM | POA: Diagnosis not present

## 2019-12-08 DIAGNOSIS — F1120 Opioid dependence, uncomplicated: Secondary | ICD-10-CM | POA: Diagnosis not present

## 2019-12-09 ENCOUNTER — Other Ambulatory Visit: Payer: Medicare HMO

## 2019-12-11 DIAGNOSIS — F1120 Opioid dependence, uncomplicated: Secondary | ICD-10-CM | POA: Diagnosis not present

## 2019-12-15 DIAGNOSIS — F1120 Opioid dependence, uncomplicated: Secondary | ICD-10-CM | POA: Diagnosis not present

## 2019-12-22 DIAGNOSIS — F1120 Opioid dependence, uncomplicated: Secondary | ICD-10-CM | POA: Diagnosis not present

## 2019-12-25 DIAGNOSIS — F1120 Opioid dependence, uncomplicated: Secondary | ICD-10-CM | POA: Diagnosis not present

## 2019-12-29 DIAGNOSIS — F1120 Opioid dependence, uncomplicated: Secondary | ICD-10-CM | POA: Diagnosis not present

## 2019-12-31 ENCOUNTER — Ambulatory Visit (INDEPENDENT_AMBULATORY_CARE_PROVIDER_SITE_OTHER): Payer: Medicare HMO | Admitting: *Deleted

## 2019-12-31 DIAGNOSIS — I63422 Cerebral infarction due to embolism of left anterior cerebral artery: Secondary | ICD-10-CM | POA: Diagnosis not present

## 2020-01-01 DIAGNOSIS — F1120 Opioid dependence, uncomplicated: Secondary | ICD-10-CM | POA: Diagnosis not present

## 2020-01-03 LAB — CUP PACEART REMOTE DEVICE CHECK
Date Time Interrogation Session: 20210110141627
Implantable Pulse Generator Implant Date: 20200728

## 2020-01-05 DIAGNOSIS — F1120 Opioid dependence, uncomplicated: Secondary | ICD-10-CM | POA: Diagnosis not present

## 2020-01-08 DIAGNOSIS — F1120 Opioid dependence, uncomplicated: Secondary | ICD-10-CM | POA: Diagnosis not present

## 2020-01-12 DIAGNOSIS — F1120 Opioid dependence, uncomplicated: Secondary | ICD-10-CM | POA: Diagnosis not present

## 2020-01-20 DIAGNOSIS — F1120 Opioid dependence, uncomplicated: Secondary | ICD-10-CM | POA: Diagnosis not present

## 2020-01-27 DIAGNOSIS — F1120 Opioid dependence, uncomplicated: Secondary | ICD-10-CM | POA: Diagnosis not present

## 2020-01-28 DIAGNOSIS — F1120 Opioid dependence, uncomplicated: Secondary | ICD-10-CM | POA: Diagnosis not present

## 2020-01-29 DIAGNOSIS — F1120 Opioid dependence, uncomplicated: Secondary | ICD-10-CM | POA: Diagnosis not present

## 2020-01-31 ENCOUNTER — Ambulatory Visit (INDEPENDENT_AMBULATORY_CARE_PROVIDER_SITE_OTHER): Payer: Medicare HMO | Admitting: *Deleted

## 2020-01-31 DIAGNOSIS — I63422 Cerebral infarction due to embolism of left anterior cerebral artery: Secondary | ICD-10-CM | POA: Diagnosis not present

## 2020-01-31 LAB — CUP PACEART REMOTE DEVICE CHECK
Date Time Interrogation Session: 20210208000913
Implantable Pulse Generator Implant Date: 20200728

## 2020-02-01 ENCOUNTER — Telehealth: Payer: Self-pay | Admitting: *Deleted

## 2020-02-01 NOTE — Telephone Encounter (Signed)
LVM to call office to go over screening questions prior to visit tomorrow.Ricky Walter, CMA  

## 2020-02-01 NOTE — Progress Notes (Signed)
ILR Remote 

## 2020-02-02 ENCOUNTER — Other Ambulatory Visit: Payer: Medicare HMO

## 2020-02-02 ENCOUNTER — Other Ambulatory Visit: Payer: Self-pay

## 2020-02-02 DIAGNOSIS — E039 Hypothyroidism, unspecified: Secondary | ICD-10-CM | POA: Diagnosis not present

## 2020-02-03 DIAGNOSIS — F1120 Opioid dependence, uncomplicated: Secondary | ICD-10-CM | POA: Diagnosis not present

## 2020-02-03 LAB — TSH: TSH: 0.168 u[IU]/mL — ABNORMAL LOW (ref 0.450–4.500)

## 2020-02-03 LAB — T4, FREE: Free T4: 2.33 ng/dL — ABNORMAL HIGH (ref 0.82–1.77)

## 2020-02-05 DIAGNOSIS — F1120 Opioid dependence, uncomplicated: Secondary | ICD-10-CM | POA: Diagnosis not present

## 2020-02-10 DIAGNOSIS — F1120 Opioid dependence, uncomplicated: Secondary | ICD-10-CM | POA: Diagnosis not present

## 2020-02-11 DIAGNOSIS — F1120 Opioid dependence, uncomplicated: Secondary | ICD-10-CM | POA: Diagnosis not present

## 2020-02-17 DIAGNOSIS — F1120 Opioid dependence, uncomplicated: Secondary | ICD-10-CM | POA: Diagnosis not present

## 2020-02-19 ENCOUNTER — Other Ambulatory Visit: Payer: Self-pay | Admitting: Family Medicine

## 2020-02-19 DIAGNOSIS — E785 Hyperlipidemia, unspecified: Secondary | ICD-10-CM

## 2020-02-24 DIAGNOSIS — F1120 Opioid dependence, uncomplicated: Secondary | ICD-10-CM | POA: Diagnosis not present

## 2020-03-02 ENCOUNTER — Ambulatory Visit (INDEPENDENT_AMBULATORY_CARE_PROVIDER_SITE_OTHER): Payer: Medicare HMO | Admitting: *Deleted

## 2020-03-02 DIAGNOSIS — I63422 Cerebral infarction due to embolism of left anterior cerebral artery: Secondary | ICD-10-CM

## 2020-03-02 DIAGNOSIS — F1120 Opioid dependence, uncomplicated: Secondary | ICD-10-CM | POA: Diagnosis not present

## 2020-03-02 LAB — CUP PACEART REMOTE DEVICE CHECK
Date Time Interrogation Session: 20210311005714
Implantable Pulse Generator Implant Date: 20200728

## 2020-03-03 NOTE — Progress Notes (Signed)
ILR Remote 

## 2020-03-09 DIAGNOSIS — F1120 Opioid dependence, uncomplicated: Secondary | ICD-10-CM | POA: Diagnosis not present

## 2020-03-16 DIAGNOSIS — F1120 Opioid dependence, uncomplicated: Secondary | ICD-10-CM | POA: Diagnosis not present

## 2020-03-21 DIAGNOSIS — F1120 Opioid dependence, uncomplicated: Secondary | ICD-10-CM | POA: Diagnosis not present

## 2020-03-30 DIAGNOSIS — F1120 Opioid dependence, uncomplicated: Secondary | ICD-10-CM | POA: Diagnosis not present

## 2020-03-31 DIAGNOSIS — F1120 Opioid dependence, uncomplicated: Secondary | ICD-10-CM | POA: Diagnosis not present

## 2020-04-03 ENCOUNTER — Ambulatory Visit (INDEPENDENT_AMBULATORY_CARE_PROVIDER_SITE_OTHER): Payer: Medicare HMO | Admitting: *Deleted

## 2020-04-03 DIAGNOSIS — I63422 Cerebral infarction due to embolism of left anterior cerebral artery: Secondary | ICD-10-CM | POA: Diagnosis not present

## 2020-04-05 LAB — CUP PACEART REMOTE DEVICE CHECK
Date Time Interrogation Session: 20210411035519
Implantable Pulse Generator Implant Date: 20200728

## 2020-04-05 NOTE — Progress Notes (Signed)
ILR Remote 

## 2020-04-06 DIAGNOSIS — F1120 Opioid dependence, uncomplicated: Secondary | ICD-10-CM | POA: Diagnosis not present

## 2020-04-14 DIAGNOSIS — F1120 Opioid dependence, uncomplicated: Secondary | ICD-10-CM | POA: Diagnosis not present

## 2020-04-21 DIAGNOSIS — F1120 Opioid dependence, uncomplicated: Secondary | ICD-10-CM | POA: Diagnosis not present

## 2020-04-28 DIAGNOSIS — F1120 Opioid dependence, uncomplicated: Secondary | ICD-10-CM | POA: Diagnosis not present

## 2020-05-03 ENCOUNTER — Encounter: Payer: Medicare HMO | Admitting: Family Medicine

## 2020-05-04 LAB — CUP PACEART REMOTE DEVICE CHECK
Date Time Interrogation Session: 20210512035916
Implantable Pulse Generator Implant Date: 20200728

## 2020-05-05 DIAGNOSIS — F1120 Opioid dependence, uncomplicated: Secondary | ICD-10-CM | POA: Diagnosis not present

## 2020-05-08 ENCOUNTER — Ambulatory Visit (INDEPENDENT_AMBULATORY_CARE_PROVIDER_SITE_OTHER): Payer: Medicare HMO | Admitting: *Deleted

## 2020-05-08 DIAGNOSIS — I63422 Cerebral infarction due to embolism of left anterior cerebral artery: Secondary | ICD-10-CM

## 2020-05-09 NOTE — Progress Notes (Signed)
Carelink Summary Report / Loop Recorder 

## 2020-05-10 ENCOUNTER — Other Ambulatory Visit: Payer: Self-pay | Admitting: Family Medicine

## 2020-05-10 DIAGNOSIS — G8929 Other chronic pain: Secondary | ICD-10-CM

## 2020-05-12 ENCOUNTER — Encounter: Payer: Self-pay | Admitting: Family Medicine

## 2020-05-12 ENCOUNTER — Other Ambulatory Visit: Payer: Self-pay

## 2020-05-12 ENCOUNTER — Ambulatory Visit (INDEPENDENT_AMBULATORY_CARE_PROVIDER_SITE_OTHER): Payer: Medicare HMO | Admitting: Family Medicine

## 2020-05-12 ENCOUNTER — Encounter: Payer: Medicare HMO | Admitting: Family Medicine

## 2020-05-12 VITALS — BP 135/70 | HR 70 | Ht 71.5 in | Wt 270.2 lb

## 2020-05-12 DIAGNOSIS — E039 Hypothyroidism, unspecified: Secondary | ICD-10-CM

## 2020-05-12 DIAGNOSIS — F1120 Opioid dependence, uncomplicated: Secondary | ICD-10-CM | POA: Diagnosis not present

## 2020-05-12 DIAGNOSIS — M545 Low back pain, unspecified: Secondary | ICD-10-CM

## 2020-05-12 DIAGNOSIS — G8929 Other chronic pain: Secondary | ICD-10-CM

## 2020-05-12 MED ORDER — BACLOFEN 10 MG PO TABS: 10.0000 mg | ORAL_TABLET | Freq: Three times a day (TID) | ORAL | 0 refills | Status: AC

## 2020-05-12 MED ORDER — MELOXICAM 15 MG PO TABS
15.0000 mg | ORAL_TABLET | Freq: Every day | ORAL | 0 refills | Status: AC
Start: 2020-05-12 — End: 2020-05-26

## 2020-05-12 NOTE — Progress Notes (Addendum)
    SUBJECTIVE:   CHIEF COMPLAINT / HPI:   Hypothyroidism [name] states he has been taking Levothyroxine daily as prescribed. He takes and states he rarely misses doses. He had his thyroid function tested in February at another doctor's office and for some reason never followed up. He denies weight loss, sweating, temperature intolerance, palpitations, or skin changes. He does endorse weight gain of about 40 lbs but states he knows he "hasn't been eating right".  Low Back Pain 1 month ago patient states he started having low back pain on his right side. He did do a lot of garden work that weekend before his back pain started. It is located in the right lower side of his back and does not radiate anywhere. He does not remember any inciting event other than the extra garden work. He denies any sciatica and no loss of balance and no LE weakness. He denies fever, chills, nausea, vomiting, abdominal pain, dysuria, no blood in the urine or stool.  PERTINENT  PMH / PSH: HTN, Chronic Hep C, Hx of Stroke, ED, HLD  OBJECTIVE:   BP 135/70   Pulse 70   Ht 5' 11.5" (1.816 m)   Wt 270 lb 3.2 oz (122.6 kg)   SpO2 99%   BMI 37.16 kg/m   Gen: NAD Cardio: RRR, no murmurs Resp: CTAB Lumbar spine: - Inspection: no gross deformity or asymmetry, swelling or ecchymosis. No skin changes - Palpation: No TTP over the spinous processes, TTP in the lower lumbar paraspinal muscles, no TTP of SI joints b/l - ROM: limited active ROM of the lumbar spine in flexion and extension without pain - Strength: 5/5 strength of lower extremity in L4-S1 nerve root distributions b/l - Neuro: sensation intact in the L4-S1 nerve root distribution b/l, 2+ L4 and S1 reflexes - Special testing: Negative straight leg raise  ASSESSMENT/PLAN:   Hypothyroidism Recheck of TSH and T4 shows TSH is slightly elevated above normal at 5.2 His previous TSH was low so seems the dose is working. Will increase dose to  - Given he is on will increase very slowly and recheck in 6 weeks. Will call patient to inform him of his new dose and to return to clinic in 6 weeks  Low back pain Acute on chronic, most likely MSK low back strain/sprain. Less than 3 months with no red flag symptoms so no imagining indicated - Baclofen as needed - Meloxicam for 14 days     Arlyce Harman, DO [facility]

## 2020-05-12 NOTE — Patient Instructions (Signed)
It was great to see you today! Thank you for letting me participate in your care!  Today, we discussed your thyroid levels that got checked in February are were abnormal. I am repeating them today and will call you if they are still abnormal and require further medication adjustment.  I have prescribed two medications for your backpain. Please take them as prescribed. You can also use OTC Capsaicin cream and heating pads.  Be well, Jules Schick, DO PGY-3, [facility]

## 2020-05-13 LAB — TSH+FREE T4
Free T4: 1.26 ng/dL (ref 0.82–1.77)
TSH: 5.2 u[IU]/mL — ABNORMAL HIGH (ref 0.450–4.500)

## 2020-05-14 NOTE — Assessment & Plan Note (Signed)
Recheck of TSH and T4 shows TSH is slightly elevated above normal at 5.2 His previous TSH was low so seems the dose is working. Will increase dose to - Given he is on will increase very slowly and recheck in 6 weeks. Will call patient to inform him of his new dose and to return to clinic in 6 weeks

## 2020-05-14 NOTE — Assessment & Plan Note (Signed)
Acute on chronic, most likely MSK low back strain/sprain. Less than 3 months with no red flag symptoms so no imagining indicated - Baclofen as needed - Meloxicam for 14 days

## 2020-05-16 ENCOUNTER — Other Ambulatory Visit: Payer: Self-pay | Admitting: Family Medicine

## 2020-05-16 MED ORDER — LEVOTHYROXINE SODIUM 112 MCG PO TABS: 224.0000 ug | ORAL_TABLET | Freq: Every day | ORAL | 0 refills | Status: DC

## 2020-05-16 NOTE — Progress Notes (Signed)
TSH elevated slightly. Will increase slowly and recheck TSH in 6 months. Called patient and informed him of results and sent in new prescription.

## 2020-05-19 DIAGNOSIS — F1120 Opioid dependence, uncomplicated: Secondary | ICD-10-CM | POA: Diagnosis not present

## 2020-05-26 DIAGNOSIS — F1120 Opioid dependence, uncomplicated: Secondary | ICD-10-CM | POA: Diagnosis not present

## 2020-06-02 DIAGNOSIS — F1120 Opioid dependence, uncomplicated: Secondary | ICD-10-CM | POA: Diagnosis not present

## 2020-06-05 ENCOUNTER — Ambulatory Visit: Payer: Medicare HMO | Admitting: Family Medicine

## 2020-06-09 DIAGNOSIS — F1120 Opioid dependence, uncomplicated: Secondary | ICD-10-CM | POA: Diagnosis not present

## 2020-06-12 ENCOUNTER — Ambulatory Visit (INDEPENDENT_AMBULATORY_CARE_PROVIDER_SITE_OTHER): Payer: Medicare HMO | Admitting: *Deleted

## 2020-06-12 ENCOUNTER — Other Ambulatory Visit: Payer: Self-pay | Admitting: Family Medicine

## 2020-06-12 DIAGNOSIS — I63422 Cerebral infarction due to embolism of left anterior cerebral artery: Secondary | ICD-10-CM | POA: Diagnosis not present

## 2020-06-12 DIAGNOSIS — E785 Hyperlipidemia, unspecified: Secondary | ICD-10-CM

## 2020-06-12 LAB — CUP PACEART REMOTE DEVICE CHECK
Date Time Interrogation Session: 20210620233429
Implantable Pulse Generator Implant Date: 20200728

## 2020-06-13 NOTE — Progress Notes (Signed)
Carelink Summary Report / Loop Recorder 

## 2020-06-16 DIAGNOSIS — F1120 Opioid dependence, uncomplicated: Secondary | ICD-10-CM | POA: Diagnosis not present

## 2020-06-20 ENCOUNTER — Ambulatory Visit (INDEPENDENT_AMBULATORY_CARE_PROVIDER_SITE_OTHER): Payer: Medicare HMO | Admitting: Family Medicine

## 2020-06-20 ENCOUNTER — Other Ambulatory Visit: Payer: Self-pay

## 2020-06-20 VITALS — BP 130/76 | HR 72 | Wt 270.8 lb

## 2020-06-20 DIAGNOSIS — M545 Low back pain: Secondary | ICD-10-CM

## 2020-06-20 DIAGNOSIS — G8929 Other chronic pain: Secondary | ICD-10-CM

## 2020-06-20 NOTE — Progress Notes (Signed)
° °  CHIEF COMPLAINT / HPI: 64 year old male who presents for lower back pain. States the pain is midline and feels like it is "deep inside". Of note the patient was seen back in 05/12/2020 for similar pain. He was given meloxicam and baclofen which was not helpful per his report. States he has tried physical therapy in the past but this did not work either.  Patient states that he is coming in to "try and get some answers about his back pain".  Of note the patient had cervical spine surgery back in 2017 at [facility].  He had an MRI performed on his lumbar spine at the time which showed severe degenerative disc disease and facet joint arthropathy.  He does have some residual left-sided sciatica from his previous cervical spine procedures, but this has not changed.  He reports no urinary/fecal incontinence and no formal leg weakness or arm weakness.  Of note the patient states that the one time he did get relief was when he tried to relatives OxyContin.  PERTINENT  PMH / PSH: As above   OBJECTIVE: BP 130/76    Pulse 72    Wt 270 lb 12.8 oz (122.8 kg)    SpO2 97%    BMI 37.24 kg/m   Gen: 64 year old pleasant, African-American male, no acute distress Back: Tenderness to palpation just lateral to lumbar spine bilaterally.  Flexion, extension, rotation of back does not affect his pain. Resp: Nonlabored, no respiratory distress Neuro: CN II through XII intact, no focal neurologic deficit.  No gait abnormality.   ASSESSMENT / PLAN:  Low back pain Likely secondary to the patient's degenerative disc disease and facet joint arthropathy as seen on MRI in 2017.  I did explain that this is a chronic degenerative problem which will likely not get better without surgical intervention.  I discussed increasing patient's gabapentin, switching his NSAID and muscle relaxer, adding Tylenol, and sending him to formal physical therapy the patient did not want to do any of these options.  I explained I do not start opiates for  chronic back pain, but will be happy to refer him to pain management.  Patient was agreeable to this plan and did not desire any further intervention.    Myrene Buddy, MD [facility]

## 2020-06-20 NOTE — Patient Instructions (Signed)
It was great seeing you today! I am sorry about your left lower back pain. Unfortunately this looks to be due to combination of degenerative disc disease and facet joint arthritis. These are degenerative processes which will not get better without surgery. Even with surgery oftentimes there are long-term side effects. We went through a variety of different treatment options, but ultimately we decided just to refer you to pain management. This is a good idea as they have a number of different treatment options that I do not offer. Lastly I gave you a printout of your MRI report from 2017 detailing these findings.

## 2020-06-20 NOTE — Assessment & Plan Note (Signed)
Likely secondary to the patient's degenerative disc disease and facet joint arthropathy as seen on MRI in 2017.  I did explain that this is a chronic degenerative problem which will likely not get better without surgical intervention.  I discussed increasing patient's gabapentin, switching his NSAID and muscle relaxer, adding Tylenol, and sending him to formal physical therapy the patient did not want to do any of these options.  I explained I do not start opiates for chronic back pain, but will be happy to refer him to pain management.  Patient was agreeable to this plan and did not desire any further intervention.

## 2020-06-23 ENCOUNTER — Encounter: Payer: Self-pay | Admitting: Physical Medicine & Rehabilitation

## 2020-06-23 DIAGNOSIS — F1120 Opioid dependence, uncomplicated: Secondary | ICD-10-CM | POA: Diagnosis not present

## 2020-06-28 ENCOUNTER — Ambulatory Visit: Payer: Medicare HMO | Admitting: Family Medicine

## 2020-06-30 DIAGNOSIS — F1120 Opioid dependence, uncomplicated: Secondary | ICD-10-CM | POA: Diagnosis not present

## 2020-07-04 DIAGNOSIS — F1120 Opioid dependence, uncomplicated: Secondary | ICD-10-CM | POA: Diagnosis not present

## 2020-07-07 DIAGNOSIS — F1120 Opioid dependence, uncomplicated: Secondary | ICD-10-CM | POA: Diagnosis not present

## 2020-07-10 DIAGNOSIS — F1120 Opioid dependence, uncomplicated: Secondary | ICD-10-CM | POA: Diagnosis not present

## 2020-07-14 DIAGNOSIS — F1120 Opioid dependence, uncomplicated: Secondary | ICD-10-CM | POA: Diagnosis not present

## 2020-07-17 ENCOUNTER — Ambulatory Visit (INDEPENDENT_AMBULATORY_CARE_PROVIDER_SITE_OTHER): Payer: Medicare HMO | Admitting: *Deleted

## 2020-07-17 ENCOUNTER — Ambulatory Visit: Payer: Medicare HMO | Admitting: Family Medicine

## 2020-07-17 DIAGNOSIS — I63422 Cerebral infarction due to embolism of left anterior cerebral artery: Secondary | ICD-10-CM

## 2020-07-18 LAB — CUP PACEART REMOTE DEVICE CHECK
Date Time Interrogation Session: 20210725233241
Implantable Pulse Generator Implant Date: 20200728

## 2020-07-20 ENCOUNTER — Encounter: Payer: Medicare HMO | Attending: Physical Medicine & Rehabilitation | Admitting: Physical Medicine & Rehabilitation

## 2020-07-20 NOTE — Progress Notes (Signed)
Carelink Summary Report / Loop Recorder 

## 2020-07-23 DIAGNOSIS — F1120 Opioid dependence, uncomplicated: Secondary | ICD-10-CM | POA: Diagnosis not present

## 2020-07-26 DIAGNOSIS — F1120 Opioid dependence, uncomplicated: Secondary | ICD-10-CM | POA: Diagnosis not present

## 2020-07-30 DIAGNOSIS — F1120 Opioid dependence, uncomplicated: Secondary | ICD-10-CM | POA: Diagnosis not present

## 2020-08-01 ENCOUNTER — Ambulatory Visit: Payer: Medicare HMO | Admitting: Family Medicine

## 2020-08-02 DIAGNOSIS — F1120 Opioid dependence, uncomplicated: Secondary | ICD-10-CM | POA: Diagnosis not present

## 2020-08-07 DIAGNOSIS — F1120 Opioid dependence, uncomplicated: Secondary | ICD-10-CM | POA: Diagnosis not present

## 2020-08-08 DIAGNOSIS — F1120 Opioid dependence, uncomplicated: Secondary | ICD-10-CM | POA: Diagnosis not present

## 2020-08-13 DIAGNOSIS — F1120 Opioid dependence, uncomplicated: Secondary | ICD-10-CM | POA: Diagnosis not present

## 2020-08-15 DIAGNOSIS — F1120 Opioid dependence, uncomplicated: Secondary | ICD-10-CM | POA: Diagnosis not present

## 2020-08-19 ENCOUNTER — Other Ambulatory Visit: Payer: Self-pay | Admitting: Family Medicine

## 2020-08-20 DIAGNOSIS — F1120 Opioid dependence, uncomplicated: Secondary | ICD-10-CM | POA: Diagnosis not present

## 2020-08-21 ENCOUNTER — Ambulatory Visit (INDEPENDENT_AMBULATORY_CARE_PROVIDER_SITE_OTHER): Payer: Medicare HMO | Admitting: *Deleted

## 2020-08-21 DIAGNOSIS — I63422 Cerebral infarction due to embolism of left anterior cerebral artery: Secondary | ICD-10-CM | POA: Diagnosis not present

## 2020-08-21 LAB — CUP PACEART REMOTE DEVICE CHECK
Date Time Interrogation Session: 20210827235043
Implantable Pulse Generator Implant Date: 20200728

## 2020-08-23 NOTE — Progress Notes (Signed)
Carelink Summary Report / Loop Recorder 

## 2020-08-24 DIAGNOSIS — F1120 Opioid dependence, uncomplicated: Secondary | ICD-10-CM | POA: Diagnosis not present

## 2020-08-27 DIAGNOSIS — F1120 Opioid dependence, uncomplicated: Secondary | ICD-10-CM | POA: Diagnosis not present

## 2020-09-03 DIAGNOSIS — F1120 Opioid dependence, uncomplicated: Secondary | ICD-10-CM | POA: Diagnosis not present

## 2020-09-10 DIAGNOSIS — F1120 Opioid dependence, uncomplicated: Secondary | ICD-10-CM | POA: Diagnosis not present

## 2020-09-17 DIAGNOSIS — F1120 Opioid dependence, uncomplicated: Secondary | ICD-10-CM | POA: Diagnosis not present

## 2020-09-21 ENCOUNTER — Other Ambulatory Visit: Payer: Self-pay | Admitting: Nurse Practitioner

## 2020-09-21 DIAGNOSIS — B182 Chronic viral hepatitis C: Secondary | ICD-10-CM | POA: Diagnosis not present

## 2020-09-21 DIAGNOSIS — K7401 Hepatic fibrosis, early fibrosis: Secondary | ICD-10-CM | POA: Diagnosis not present

## 2020-09-21 DIAGNOSIS — K760 Fatty (change of) liver, not elsewhere classified: Secondary | ICD-10-CM

## 2020-09-22 ENCOUNTER — Ambulatory Visit (INDEPENDENT_AMBULATORY_CARE_PROVIDER_SITE_OTHER): Payer: Medicare HMO | Admitting: Family Medicine

## 2020-09-22 ENCOUNTER — Encounter: Payer: Self-pay | Admitting: Family Medicine

## 2020-09-22 ENCOUNTER — Other Ambulatory Visit: Payer: Self-pay

## 2020-09-22 VITALS — BP 130/82 | HR 79 | Ht 72.0 in | Wt 279.0 lb

## 2020-09-22 DIAGNOSIS — E785 Hyperlipidemia, unspecified: Secondary | ICD-10-CM | POA: Diagnosis not present

## 2020-09-22 DIAGNOSIS — R635 Abnormal weight gain: Secondary | ICD-10-CM | POA: Diagnosis not present

## 2020-09-22 DIAGNOSIS — G8929 Other chronic pain: Secondary | ICD-10-CM | POA: Diagnosis not present

## 2020-09-22 DIAGNOSIS — M545 Low back pain, unspecified: Secondary | ICD-10-CM

## 2020-09-22 DIAGNOSIS — Z23 Encounter for immunization: Secondary | ICD-10-CM | POA: Diagnosis not present

## 2020-09-22 DIAGNOSIS — F1120 Opioid dependence, uncomplicated: Secondary | ICD-10-CM | POA: Diagnosis not present

## 2020-09-22 DIAGNOSIS — R600 Localized edema: Secondary | ICD-10-CM | POA: Diagnosis not present

## 2020-09-22 NOTE — Patient Instructions (Addendum)
It was great to meet you!  Our plans for today:  - A referral has been placed to [facility] for your back pain. Please call them at [phone] to schedule an appointment.   - We are checking some basic labs today. I will call you with the results if they are abnormal. Otherwise I will send a letter.  -Please bring ALL your medications to your next visit.   Take care and seek immediate care sooner if you develop any concerns.  Dr. [facility]

## 2020-09-22 NOTE — Assessment & Plan Note (Signed)
Secondary to severe degenerative disc disease of lumbar spine. Referral to PM&R from 06/20/20 is still valid. Patient given phone number and instructed to call for appointment.

## 2020-09-22 NOTE — Assessment & Plan Note (Signed)
Last lipid panel 07/18/2019 showed total cholesterol 184, TG 87, HDL 46, LDL 121.  Will check lipid panel today. Continue Atorvastatin 40mg  daily.

## 2020-09-22 NOTE — Progress Notes (Signed)
    SUBJECTIVE:   CHIEF COMPLAINT / HPI:   Back Pain Patient reports midline low back pain for ~1 year, worse when he stands for prolonged periods (>10 minutes). No red flag sx (denies urinary/stool incontinence, saddle anesthesia, weight loss, or hx of cancer). Has been seen in the clinic multiple times for this complaint, most recently on 06/20/2020. At that time, patient was referred to PM&R but never heard from them and therefore has not followed up. Patient would like a new referral today. Has tried Meloxicam, Baclofen and physical therapy in the past without relief. States he gets some relief with methadone, which he has been on for 4 years. Patient is prescribed Gabapentin 200mg  TID, but he only takes 100mg  prn (usually 3x/week) as it upsets his stomach and provides minimal relief. Of note, MRI of lumbar spine in 2017 showed severe degenerative disc disease and facet joint arthropathy.  Weight Gain Patient reports 50lb weight gain over the past 18 months. He feels his body is diffusely swollen. States his methadone makes him crave sweets but his appetite and intake are overall unchanged from prior. Denies shortness of breath, PND or orthopnea.   Of note, patient has a history of hypothyroidism on synthroid. TSH mildly elevated to 5.200 on 05/12/20. Synthroid dose increased from 2018 to 05/14/20 at that time, but unclear what dose patient is currently taking. States he takes "one pill every morning". Has been prescribed and tablets in the past. Patient also with a hx of Hep C, followed in a hepatology clinic- was seen there yesterday.   PERTINENT  PMH / PSH: Hypothyroidism, Hep C, Stroke, HLD, hx of opioid use disorder  OBJECTIVE:   BP 130/82   Pulse 79   Ht 6' (1.829 m)   Wt 279 lb (126.6 kg)   SpO2 97%   BMI 37.84 kg/m   Gen: alert, well-appearing, NAD, obese HEENT: no goiter or thyroid abnormality appreciated Cardiac: RRR, normal S1/S2, no murmurs, rubs or  gallops Lungs: Normal work of breathing, breath sounds equal bilaterally, lungs CTA without wheezes or rales Abd: soft, nontender, no fluid wave appreciated Ext: 2+ pitting edema of bilateral lower extremities up to mid calf, no upper extremity edema noted  ASSESSMENT/PLAN:   Low back pain Secondary to severe degenerative disc disease of lumbar spine. Referral to PM&R from 06/20/20 is still valid. Patient given phone number and instructed to call for appointment.   Weight Gain +50 lbs over 18 months.  Differential includes:  Hypothyroidism- most likely as patient has hx of hypothyroidism and does not appear to be taking proper dose of synthroid Liver disease- possible given hx of Hep C, although labs in 2020 did not show evidence of decreased synthetic function CHF- echo in July 2020 showed EF 50% and evidence of impaired LV relaxation.  Plan: Will obtain TSH, BNP, and CMP today. Patient has RUQ 2021 scheduled in the next 2 weeks (per his hepatologist), so I will follow up those results as well.  Hyperlipidemia Last lipid panel 07/18/2019 showed total cholesterol 184, TG 87, HDL 46, LDL 121. Will obtain lipid panel today. Continue Atorvastatin 40mg  daily.  Case discussed with Dr. [place].  07/20/2019, MD [facility]

## 2020-09-23 LAB — COMPREHENSIVE METABOLIC PANEL
ALT: 11 IU/L (ref 0–44)
AST: 21 IU/L (ref 0–40)
Albumin/Globulin Ratio: 1.4 (ref 1.2–2.2)
Albumin: 4.4 g/dL (ref 3.8–4.8)
Alkaline Phosphatase: 108 IU/L (ref 44–121)
BUN/Creatinine Ratio: 8 — ABNORMAL LOW (ref 10–24)
BUN: 9 mg/dL (ref 8–27)
Bilirubin Total: 0.3 mg/dL (ref 0.0–1.2)
CO2: 26 mmol/L (ref 20–29)
Calcium: 9.2 mg/dL (ref 8.6–10.2)
Chloride: 101 mmol/L (ref 96–106)
Creatinine, Ser: 1.1 mg/dL (ref 0.76–1.27)
GFR calc Af Amer: 82 mL/min/{1.73_m2} (ref >59)
GFR calc non Af Amer: 71 mL/min/{1.73_m2} (ref >59)
Globulin, Total: 3.2 g/dL (ref 1.5–4.5)
Glucose: 100 mg/dL — ABNORMAL HIGH (ref 65–99)
Potassium: 4.4 mmol/L (ref 3.5–5.2)
Sodium: 141 mmol/L (ref 134–144)
Total Protein: 7.6 g/dL (ref 6.0–8.5)

## 2020-09-23 LAB — LIPID PANEL
Chol/HDL Ratio: 2.8 ratio (ref 0.0–5.0)
Cholesterol, Total: 136 mg/dL (ref 100–199)
HDL: 48 mg/dL (ref >39)
LDL Chol Calc (NIH): 68 mg/dL (ref 0–99)
Triglycerides: 111 mg/dL (ref 0–149)
VLDL Cholesterol Cal: 20 mg/dL (ref 5–40)

## 2020-09-23 LAB — BRAIN NATRIURETIC PEPTIDE: BNP: 15.9 pg/mL (ref 0.0–100.0)

## 2020-09-23 LAB — TSH: TSH: 7.61 u[IU]/mL — ABNORMAL HIGH (ref 0.450–4.500)

## 2020-09-24 DIAGNOSIS — F1120 Opioid dependence, uncomplicated: Secondary | ICD-10-CM | POA: Diagnosis not present

## 2020-09-25 ENCOUNTER — Ambulatory Visit (INDEPENDENT_AMBULATORY_CARE_PROVIDER_SITE_OTHER): Payer: Medicare HMO

## 2020-09-25 DIAGNOSIS — I63422 Cerebral infarction due to embolism of left anterior cerebral artery: Secondary | ICD-10-CM

## 2020-09-25 LAB — CUP PACEART REMOTE DEVICE CHECK
Date Time Interrogation Session: 20210929235132
Implantable Pulse Generator Implant Date: 20200728

## 2020-09-26 ENCOUNTER — Telehealth: Payer: Self-pay | Admitting: Family Medicine

## 2020-09-26 NOTE — Telephone Encounter (Signed)
Called to inform patient of recent lab results: TSH elevated to 7.610.  CMP, lipid panel, and BNP within normal limits.  Patient will call office later this morning to confirm Synthroid dose that he's currently taking.  Once confirmed, I will increase his Synthroid as appropriate. See TSH result note for additional details.

## 2020-09-27 ENCOUNTER — Telehealth: Payer: Self-pay | Admitting: Family Medicine

## 2020-09-27 NOTE — Progress Notes (Signed)
Carelink Summary Report / Loop Recorder 

## 2020-09-27 NOTE — Telephone Encounter (Signed)
Pt walked in to provide medication information 180 Levothyroxine Sodium 112 MCG Tablets; take two tablets 224 mg daily

## 2020-09-28 ENCOUNTER — Other Ambulatory Visit: Payer: Self-pay | Admitting: Family Medicine

## 2020-09-28 DIAGNOSIS — E039 Hypothyroidism, unspecified: Secondary | ICD-10-CM

## 2020-09-28 MED ORDER — LEVOTHYROXINE SODIUM 112 MCG PO TABS: 224.0000 ug | ORAL_TABLET | Freq: Every day | ORAL | 0 refills | Status: DC

## 2020-10-01 DIAGNOSIS — F1120 Opioid dependence, uncomplicated: Secondary | ICD-10-CM | POA: Diagnosis not present

## 2020-10-03 ENCOUNTER — Other Ambulatory Visit: Payer: Medicare HMO

## 2020-10-08 DIAGNOSIS — F1120 Opioid dependence, uncomplicated: Secondary | ICD-10-CM | POA: Diagnosis not present

## 2020-10-13 ENCOUNTER — Ambulatory Visit: Payer: Medicare HMO

## 2020-10-15 DIAGNOSIS — F1120 Opioid dependence, uncomplicated: Secondary | ICD-10-CM | POA: Diagnosis not present

## 2020-10-22 DIAGNOSIS — F1120 Opioid dependence, uncomplicated: Secondary | ICD-10-CM | POA: Diagnosis not present

## 2020-10-25 DIAGNOSIS — F1120 Opioid dependence, uncomplicated: Secondary | ICD-10-CM | POA: Diagnosis not present

## 2020-10-27 LAB — CUP PACEART REMOTE DEVICE CHECK
Date Time Interrogation Session: 20211101235451
Implantable Pulse Generator Implant Date: 20200728

## 2020-10-29 DIAGNOSIS — F1120 Opioid dependence, uncomplicated: Secondary | ICD-10-CM | POA: Diagnosis not present

## 2020-10-30 ENCOUNTER — Ambulatory Visit (INDEPENDENT_AMBULATORY_CARE_PROVIDER_SITE_OTHER): Payer: Medicare HMO

## 2020-10-30 DIAGNOSIS — I63422 Cerebral infarction due to embolism of left anterior cerebral artery: Secondary | ICD-10-CM

## 2020-10-30 DIAGNOSIS — F1120 Opioid dependence, uncomplicated: Secondary | ICD-10-CM | POA: Diagnosis not present

## 2020-10-31 NOTE — Progress Notes (Signed)
Carelink Summary Report / Loop Recorder 

## 2020-11-01 ENCOUNTER — Other Ambulatory Visit: Payer: Self-pay

## 2020-11-01 ENCOUNTER — Ambulatory Visit (INDEPENDENT_AMBULATORY_CARE_PROVIDER_SITE_OTHER): Payer: Medicare HMO

## 2020-11-01 DIAGNOSIS — Z23 Encounter for immunization: Secondary | ICD-10-CM | POA: Diagnosis not present

## 2020-11-01 NOTE — Progress Notes (Signed)
   Covid-19 Vaccination Clinic  Name:  Ricky Walter    MRN: [id number] DOB: 02/24/1956  11/01/2020  [name] was observed post Covid-19 immunization for 15 minutes without incident. He was provided with Vaccine Information Sheet and instruction to access the V-Safe system.   [name] was instructed to call 911 with any severe reactions post vaccine: Marland Kitchen Difficulty breathing  . Swelling of face and throat  . A fast heartbeat  . A bad rash all over body  . Dizziness and weakness   # Covid Vaccine administered LD without complication.

## 2020-11-05 DIAGNOSIS — F1120 Opioid dependence, uncomplicated: Secondary | ICD-10-CM | POA: Diagnosis not present

## 2020-11-13 DIAGNOSIS — F1120 Opioid dependence, uncomplicated: Secondary | ICD-10-CM | POA: Diagnosis not present

## 2020-11-21 DIAGNOSIS — F1120 Opioid dependence, uncomplicated: Secondary | ICD-10-CM | POA: Diagnosis not present

## 2020-11-23 DIAGNOSIS — F1120 Opioid dependence, uncomplicated: Secondary | ICD-10-CM | POA: Diagnosis not present

## 2020-11-27 DIAGNOSIS — F1120 Opioid dependence, uncomplicated: Secondary | ICD-10-CM | POA: Diagnosis not present

## 2020-12-03 LAB — CUP PACEART REMOTE DEVICE CHECK
Date Time Interrogation Session: 20211204225903
Implantable Pulse Generator Implant Date: 20200728

## 2020-12-04 ENCOUNTER — Ambulatory Visit (INDEPENDENT_AMBULATORY_CARE_PROVIDER_SITE_OTHER): Payer: Medicare HMO

## 2020-12-04 DIAGNOSIS — I63422 Cerebral infarction due to embolism of left anterior cerebral artery: Secondary | ICD-10-CM

## 2020-12-04 DIAGNOSIS — F1120 Opioid dependence, uncomplicated: Secondary | ICD-10-CM | POA: Diagnosis not present

## 2020-12-08 ENCOUNTER — Other Ambulatory Visit: Payer: Self-pay | Admitting: Family Medicine

## 2020-12-08 DIAGNOSIS — E785 Hyperlipidemia, unspecified: Secondary | ICD-10-CM

## 2020-12-11 DIAGNOSIS — F1120 Opioid dependence, uncomplicated: Secondary | ICD-10-CM | POA: Diagnosis not present

## 2020-12-18 DIAGNOSIS — F1120 Opioid dependence, uncomplicated: Secondary | ICD-10-CM | POA: Diagnosis not present

## 2020-12-19 NOTE — Progress Notes (Signed)
Carelink Summary Report / Loop Recorder 

## 2020-12-25 DIAGNOSIS — F1120 Opioid dependence, uncomplicated: Secondary | ICD-10-CM | POA: Diagnosis not present

## 2020-12-27 DIAGNOSIS — F1120 Opioid dependence, uncomplicated: Secondary | ICD-10-CM | POA: Diagnosis not present

## 2021-01-01 DIAGNOSIS — F1120 Opioid dependence, uncomplicated: Secondary | ICD-10-CM | POA: Diagnosis not present

## 2021-01-02 ENCOUNTER — Encounter: Payer: Medicare HMO | Attending: Physical Medicine & Rehabilitation | Admitting: Physical Medicine & Rehabilitation

## 2021-01-08 ENCOUNTER — Ambulatory Visit (INDEPENDENT_AMBULATORY_CARE_PROVIDER_SITE_OTHER): Payer: Medicare HMO

## 2021-01-08 DIAGNOSIS — I63422 Cerebral infarction due to embolism of left anterior cerebral artery: Secondary | ICD-10-CM

## 2021-01-09 DIAGNOSIS — F1120 Opioid dependence, uncomplicated: Secondary | ICD-10-CM | POA: Diagnosis not present

## 2021-01-10 LAB — CUP PACEART REMOTE DEVICE CHECK
Date Time Interrogation Session: 20220115235723
Implantable Pulse Generator Implant Date: 20200728

## 2021-01-11 ENCOUNTER — Encounter: Payer: Self-pay | Admitting: Family Medicine

## 2021-01-11 ENCOUNTER — Other Ambulatory Visit: Payer: Self-pay

## 2021-01-11 ENCOUNTER — Ambulatory Visit (INDEPENDENT_AMBULATORY_CARE_PROVIDER_SITE_OTHER): Payer: Medicare HMO | Admitting: Family Medicine

## 2021-01-11 VITALS — BP 118/70 | HR 79 | Ht 72.0 in | Wt 277.4 lb

## 2021-01-11 DIAGNOSIS — G8929 Other chronic pain: Secondary | ICD-10-CM | POA: Diagnosis not present

## 2021-01-11 DIAGNOSIS — N529 Male erectile dysfunction, unspecified: Secondary | ICD-10-CM

## 2021-01-11 DIAGNOSIS — E039 Hypothyroidism, unspecified: Secondary | ICD-10-CM | POA: Diagnosis not present

## 2021-01-11 DIAGNOSIS — M545 Low back pain, unspecified: Secondary | ICD-10-CM

## 2021-01-11 MED ORDER — TADALAFIL 20 MG PO TABS
10.0000 mg | ORAL_TABLET | ORAL | 11 refills | Status: DC | PRN
Start: 2021-01-11 — End: 2021-03-26

## 2021-01-11 NOTE — Progress Notes (Signed)
    SUBJECTIVE:   CHIEF COMPLAINT / HPI:   Back Pain Patient reports low back pain for >2 years. He has been seen in clinic multiple times for his back pain in the past. No red flag symptoms. He was previously referred to the pain center/PM&R but never followed up (there was a miscommunication about his appointments). He thinks he may have had imaging done at some point in the past. Never been told exactly why he has pain and he'd really like to know.  Erectile Dysfunction Patient has a hx of erectile dysfunction. He reports difficulty achieving erections and this has become more bothersome recently. He attempts sexual intercourse approximately 2-3x/week without success. Does not have any nocturnal tumescence. Previously trialed sildenafil with some improvement although not much.   PERTINENT  PMH / PSH: Hep C, HLD, hx of opioid use disorder (on methadone)  OBJECTIVE:   BP 118/70   Pulse 79   Ht 6' (1.829 m)   Wt 277 lb 6 oz (125.8 kg)   SpO2 96%   BMI 37.62 kg/m   Gen: alert, obese male, NAD Back: no bony tenderness to palpation, some lumbar paraspinal muscle tenderness on left. Negative straight leg raise. Neuro: gait is slow, somewhat uneven as patient seems to have some weakness in his L leg (? From prior CVA). Otherwise neuro exam is grossly intact.   ASSESSMENT/PLAN:   Erectile dysfunction Patient has known hx of vascular disease (prior CVA) as well as hypothyroidism which could be contributing to his ED. -Will trial Cialis  Low back pain Chronic, unchanged. I do not see any prior imaging in his chart and given the duration of his symptoms, would be reasonable to obtain x-ray. I suspect the etiology is degenerative disc disease. Has tried physical therapy as well as Baclofen and Robaxin in the past without relief. Takes gabapentin currently (as well as methadone) with no improvement. -Lumbar x-rays ordered -Pending results of x-ray, will refer to spine center for possible  injections/additional management -Counseled patient that exercise and weight loss could help significantly with his symptoms. He was agreeable and will start a gradual walking plan.  Hypothyroidism Improving. TSH was elevated to 7 at last visit 3 months ago. His synthroid dose was increased from to at that time. -TSH within normal limits today -Continue current synthroid dose (278mcg/day)     Maury Dus, MD [facility]

## 2021-01-11 NOTE — Patient Instructions (Addendum)
It was great to see you!  Our plans for today:  - We are checking your thyroid levels again today. I will call you with the results if any changes are needed - We are getting an x-ray of your back. We can discuss a referral to pain management or the spine specialists pending the results - Please walk one lap in your house every single day x1 week. Then increase to 2 laps, etc. - We will touch base about the Cialis when I call with your other results   Take care and seek immediate care sooner if you develop any concerns.   Dr. [facility]

## 2021-01-12 LAB — TSH: TSH: 1.46 u[IU]/mL (ref 0.450–4.500)

## 2021-01-13 NOTE — Assessment & Plan Note (Addendum)
Chronic, unchanged. I do not see any prior imaging in his chart and given the duration of his symptoms, would be reasonable to obtain x-ray. I suspect the etiology is degenerative disc disease. Has tried physical therapy as well as Baclofen and Robaxin in the past without relief. Takes gabapentin currently (as well as methadone) with no improvement. -Lumbar x-rays ordered -Pending results of x-ray, will refer to spine center for possible injections/additional management -Counseled patient that exercise and weight loss could help significantly with his symptoms. He was agreeable and will start a gradual walking plan.

## 2021-01-13 NOTE — Assessment & Plan Note (Signed)
Improving. TSH was elevated to 7 at last visit 3 months ago. His synthroid dose was increased from to at that time. -TSH within normal limits today -Continue current synthroid dose (213mcg/day)

## 2021-01-13 NOTE — Assessment & Plan Note (Signed)
Patient has known hx of vascular disease (prior CVA) as well as hypothyroidism which could be contributing to his ED. -Will trial Cialis

## 2021-01-15 DIAGNOSIS — F1120 Opioid dependence, uncomplicated: Secondary | ICD-10-CM | POA: Diagnosis not present

## 2021-01-22 DIAGNOSIS — F1120 Opioid dependence, uncomplicated: Secondary | ICD-10-CM | POA: Diagnosis not present

## 2021-01-23 DIAGNOSIS — F1120 Opioid dependence, uncomplicated: Secondary | ICD-10-CM | POA: Diagnosis not present

## 2021-01-23 NOTE — Progress Notes (Signed)
Carelink Summary Report / Loop Recorder 

## 2021-01-29 DIAGNOSIS — F1120 Opioid dependence, uncomplicated: Secondary | ICD-10-CM | POA: Diagnosis not present

## 2021-02-02 ENCOUNTER — Other Ambulatory Visit: Payer: Self-pay | Admitting: Family Medicine

## 2021-02-02 DIAGNOSIS — E039 Hypothyroidism, unspecified: Secondary | ICD-10-CM

## 2021-02-05 DIAGNOSIS — F1120 Opioid dependence, uncomplicated: Secondary | ICD-10-CM | POA: Diagnosis not present

## 2021-02-08 ENCOUNTER — Ambulatory Visit (INDEPENDENT_AMBULATORY_CARE_PROVIDER_SITE_OTHER): Payer: Medicare HMO

## 2021-02-08 DIAGNOSIS — I639 Cerebral infarction, unspecified: Secondary | ICD-10-CM

## 2021-02-09 LAB — CUP PACEART REMOTE DEVICE CHECK
Date Time Interrogation Session: 20220217235849
Implantable Pulse Generator Implant Date: 20200728

## 2021-02-12 DIAGNOSIS — F1120 Opioid dependence, uncomplicated: Secondary | ICD-10-CM | POA: Diagnosis not present

## 2021-02-14 NOTE — Progress Notes (Signed)
Carelink Summary Report / Loop Recorder 

## 2021-02-19 DIAGNOSIS — F1120 Opioid dependence, uncomplicated: Secondary | ICD-10-CM | POA: Diagnosis not present

## 2021-02-20 DIAGNOSIS — F1120 Opioid dependence, uncomplicated: Secondary | ICD-10-CM | POA: Diagnosis not present

## 2021-02-26 DIAGNOSIS — F1120 Opioid dependence, uncomplicated: Secondary | ICD-10-CM | POA: Diagnosis not present

## 2021-03-05 DIAGNOSIS — F1120 Opioid dependence, uncomplicated: Secondary | ICD-10-CM | POA: Diagnosis not present

## 2021-03-08 NOTE — Progress Notes (Deleted)
    SUBJECTIVE:   CHIEF COMPLAINT / HPI:   Back pain: chronic.  Takes gabapentin and methadone. Never got his x ray.   PERTINENT  PMH / PSH: ***  OBJECTIVE:   There were no vitals taken for this visit.  ***  ASSESSMENT/PLAN:   No problem-specific Assessment & Plan notes found for this encounter.     Sandre Kitty, MD [facility]   {    This will disappear when note is signed, click to select method of visit    :1}

## 2021-03-09 ENCOUNTER — Ambulatory Visit: Payer: Medicare HMO | Admitting: Family Medicine

## 2021-03-12 ENCOUNTER — Ambulatory Visit (INDEPENDENT_AMBULATORY_CARE_PROVIDER_SITE_OTHER): Payer: Medicare HMO

## 2021-03-12 DIAGNOSIS — F1120 Opioid dependence, uncomplicated: Secondary | ICD-10-CM | POA: Diagnosis not present

## 2021-03-12 DIAGNOSIS — I63422 Cerebral infarction due to embolism of left anterior cerebral artery: Secondary | ICD-10-CM | POA: Diagnosis not present

## 2021-03-13 ENCOUNTER — Ambulatory Visit: Payer: Medicare HMO

## 2021-03-14 LAB — CUP PACEART REMOTE DEVICE CHECK
Date Time Interrogation Session: 20220323010014
Implantable Pulse Generator Implant Date: 20200728

## 2021-03-15 NOTE — Progress Notes (Deleted)
    SUBJECTIVE:   CHIEF COMPLAINT / HPI:   Back pain: Patient is a 65 year old male who presents today to discuss back pain.  He has a history of chronic low back pain and was seen for this x-rays ordered at that time.  Per the provider's note at that time if knee x-ray showed degenerative disc disease as was expected she would consider referring to the spine center for possible injections.  Unfortunately does not appear the patient was able to get his lumbar x-rays. He states***.  PERTINENT  PMH / PSH: ***  OBJECTIVE:   There were no vitals taken for this visit.   General: NAD, pleasant, able to participate in exam Cardiac: RRR, no murmurs. Respiratory: CTAB, normal effort, No wheezes, rales or rhonchi Abdomen: Bowel sounds present, nontender, nondistended, no hepatosplenomegaly. Extremities: no edema or cyanosis. MSK:*** Neuro: alert, no obvious focal deficits Psych: Normal affect and mood  ASSESSMENT/PLAN:   No problem-specific Assessment & Plan notes found for this encounter.     Jackelyn Poling, DO [facility]    This note was prepared using Dragon voice recognition software and may include unintentional dictation errors due to the inherent limitations of voice recognition software.  {    This will disappear when note is signed, click to select method of visit    :1}

## 2021-03-16 ENCOUNTER — Ambulatory Visit: Payer: Medicare HMO

## 2021-03-19 ENCOUNTER — Ambulatory Visit: Payer: Medicare HMO

## 2021-03-19 DIAGNOSIS — F1120 Opioid dependence, uncomplicated: Secondary | ICD-10-CM | POA: Diagnosis not present

## 2021-03-20 NOTE — Progress Notes (Signed)
Carelink Summary Report / Loop Recorder 

## 2021-03-25 NOTE — Progress Notes (Signed)
    SUBJECTIVE:   CHIEF COMPLAINT / HPI:   Back Pain Patient returns for his chronic low back pain. Has been present >2 yrs, but patient has not gotten any relief. Feels it's getting worse overall. The pain is located across his lower back (L=R). Worse with extension, laying flat, and walking. Improved when leaning forward or sitting. At his last visit on 01/11/21, lumbar x-rays were ordered but patient has not had them done yet (states he forgot). He has not been taking anything for pain, although in the past has tried Gabapentin, Amitriptyline, and Meloxicam. He remains on methadone.   No red flag symptoms including weight loss, fever, chills, night sweats, numbness, weakness, or bowel/bladder dysfunction. No hx of cancer.  Of note, patient had MRI Lumbar spine in 2017 which showed: L2-3: Mild disc degeneration and disc bulging. Mild facet  degeneration without significant spinal stenosis.  L3-4: Diffusedisc bulging and facet degeneration.  L4-5: Diffuse disc bulging and endplate spurring. Bilateral facet hypertrophy. Mild spinal stenosis. Mild subarticular narrowing on the right.  L5-S1: Disc degeneration with diffuse endplate spurring.    PERTINENT  PMH / PSH: Hep C, HLD, opioid use disorder (on Methadone)  OBJECTIVE:   BP 120/64   Pulse 78   Ht 6' (1.829 m)   Wt 280 lb (127 kg)   BMI 37.97 kg/m   Gen: alert, obese, NAD Back: no significant deformity noted, no bony tenderness to palpation, no paraspinal tenderness Full ROM w/flexion and rotation. Pain with extension. Negative straight leg raise bilaterally Sensation intact throughout b/l lower extremities  ASSESSMENT/PLAN:   Low back pain Chronic, unchanged from prior. Likely secondary to degenerative disease and spinal stenosis. Has used gabapentin, meloxicam, robaxin in the past without relief. Has also trialed physical therapy and amitriptyline in the past. -Will obtain lumbar x-rays (ordered at last visit but never  done) -Referral for physical therapy -Amitriptyline 25mg  daily -If no improvement with 6 weeks of PT and amitriptyline, can consider additional imaging w/MRI +/- spinal injections -Continue Methadone   Refills provided for patient's Atorvastatin today.  , MD [facility]

## 2021-03-26 ENCOUNTER — Ambulatory Visit (INDEPENDENT_AMBULATORY_CARE_PROVIDER_SITE_OTHER): Payer: Medicare HMO | Admitting: Family Medicine

## 2021-03-26 ENCOUNTER — Encounter: Payer: Self-pay | Admitting: Family Medicine

## 2021-03-26 ENCOUNTER — Other Ambulatory Visit: Payer: Self-pay

## 2021-03-26 VITALS — BP 120/64 | HR 78 | Ht 72.0 in | Wt 280.0 lb

## 2021-03-26 DIAGNOSIS — M545 Low back pain, unspecified: Secondary | ICD-10-CM | POA: Diagnosis not present

## 2021-03-26 DIAGNOSIS — G8929 Other chronic pain: Secondary | ICD-10-CM

## 2021-03-26 DIAGNOSIS — E785 Hyperlipidemia, unspecified: Secondary | ICD-10-CM

## 2021-03-26 DIAGNOSIS — F1120 Opioid dependence, uncomplicated: Secondary | ICD-10-CM | POA: Diagnosis not present

## 2021-03-26 MED ORDER — AMITRIPTYLINE HCL 25 MG PO TABS
25.0000 mg | ORAL_TABLET | Freq: Every day | ORAL | 0 refills | Status: DC
Start: 2021-03-26 — End: 2021-07-04

## 2021-03-26 MED ORDER — ATORVASTATIN CALCIUM 40 MG PO TABS
40.0000 mg | ORAL_TABLET | Freq: Every day | ORAL | 2 refills | Status: DC
Start: 2021-03-26 — End: 2021-11-30

## 2021-03-26 NOTE — Assessment & Plan Note (Signed)
Chronic, unchanged from prior. Likely secondary to degenerative disease and spinal stenosis. Has used gabapentin, meloxicam, robaxin in the past without relief. Has also trialed physical therapy and amitriptyline in the past. -Will obtain lumbar x-rays (ordered at last visit but never done) -Referral for physical therapy -Amitriptyline 25mg  daily -If no improvement with 6 weeks of PT and amitriptyline, can consider additional imaging w/MRI +/- spinal injections -Continue Methadone

## 2021-03-26 NOTE — Patient Instructions (Addendum)
It was great to see you!  Our plans for today:  - Please go to [facility] during business hours to get an xray of your back - I have placed a referral for physical therapy. They should call you to set up an appointment. - If your pain is not improved with 6 weeks of physical therapy and Amitriptyline, we will get an MRI and consider back injections  Take care and seek immediate care sooner if you develop any concerns.   Dr. [facility]

## 2021-04-03 ENCOUNTER — Ambulatory Visit
Admission: RE | Admit: 2021-04-03 | Discharge: 2021-04-03 | Disposition: A | Discharge status: Home / Self Care | Payer: Medicare HMO | Source: Ambulatory Visit | Attending: Family Medicine | Admitting: Family Medicine

## 2021-04-03 DIAGNOSIS — F1120 Opioid dependence, uncomplicated: Secondary | ICD-10-CM | POA: Diagnosis not present

## 2021-04-03 DIAGNOSIS — M545 Low back pain, unspecified: Secondary | ICD-10-CM

## 2021-04-03 DIAGNOSIS — G8929 Other chronic pain: Secondary | ICD-10-CM

## 2021-04-09 DIAGNOSIS — F1120 Opioid dependence, uncomplicated: Secondary | ICD-10-CM | POA: Diagnosis not present

## 2021-04-12 ENCOUNTER — Other Ambulatory Visit: Payer: Self-pay | Admitting: Nurse Practitioner

## 2021-04-12 ENCOUNTER — Ambulatory Visit (INDEPENDENT_AMBULATORY_CARE_PROVIDER_SITE_OTHER): Payer: Medicare HMO

## 2021-04-12 DIAGNOSIS — K7400 Hepatic fibrosis, unspecified: Secondary | ICD-10-CM

## 2021-04-12 DIAGNOSIS — I63422 Cerebral infarction due to embolism of left anterior cerebral artery: Secondary | ICD-10-CM

## 2021-04-12 DIAGNOSIS — K760 Fatty (change of) liver, not elsewhere classified: Secondary | ICD-10-CM | POA: Diagnosis not present

## 2021-04-12 DIAGNOSIS — B182 Chronic viral hepatitis C: Secondary | ICD-10-CM | POA: Diagnosis not present

## 2021-04-16 ENCOUNTER — Ambulatory Visit: Payer: Medicare HMO | Attending: Family Medicine | Admitting: Physical Therapy

## 2021-04-16 ENCOUNTER — Other Ambulatory Visit: Payer: Medicare HMO

## 2021-04-16 DIAGNOSIS — F1120 Opioid dependence, uncomplicated: Secondary | ICD-10-CM | POA: Diagnosis not present

## 2021-04-16 LAB — CUP PACEART REMOTE DEVICE CHECK
Date Time Interrogation Session: 20220425010054
Implantable Pulse Generator Implant Date: 20200728

## 2021-04-23 DIAGNOSIS — F1120 Opioid dependence, uncomplicated: Secondary | ICD-10-CM | POA: Diagnosis not present

## 2021-04-24 ENCOUNTER — Ambulatory Visit: Payer: Medicare HMO | Admitting: Family Medicine

## 2021-04-30 DIAGNOSIS — F1120 Opioid dependence, uncomplicated: Secondary | ICD-10-CM | POA: Diagnosis not present

## 2021-05-01 ENCOUNTER — Ambulatory Visit: Payer: Medicare HMO | Admitting: Family Medicine

## 2021-05-01 NOTE — Progress Notes (Signed)
Carelink Summary Report / Loop Recorder 

## 2021-05-07 ENCOUNTER — Ambulatory Visit: Payer: Medicare HMO | Admitting: Family Medicine

## 2021-05-07 DIAGNOSIS — F1120 Opioid dependence, uncomplicated: Secondary | ICD-10-CM | POA: Diagnosis not present

## 2021-05-15 DIAGNOSIS — F1120 Opioid dependence, uncomplicated: Secondary | ICD-10-CM | POA: Diagnosis not present

## 2021-05-22 DIAGNOSIS — F1120 Opioid dependence, uncomplicated: Secondary | ICD-10-CM | POA: Diagnosis not present

## 2021-05-23 DIAGNOSIS — F1120 Opioid dependence, uncomplicated: Secondary | ICD-10-CM | POA: Diagnosis not present

## 2021-05-29 DIAGNOSIS — F1120 Opioid dependence, uncomplicated: Secondary | ICD-10-CM | POA: Diagnosis not present

## 2021-06-05 DIAGNOSIS — F1120 Opioid dependence, uncomplicated: Secondary | ICD-10-CM | POA: Diagnosis not present

## 2021-06-12 DIAGNOSIS — F1120 Opioid dependence, uncomplicated: Secondary | ICD-10-CM | POA: Diagnosis not present

## 2021-06-14 ENCOUNTER — Ambulatory Visit (INDEPENDENT_AMBULATORY_CARE_PROVIDER_SITE_OTHER): Payer: Medicare HMO

## 2021-06-14 DIAGNOSIS — I63422 Cerebral infarction due to embolism of left anterior cerebral artery: Secondary | ICD-10-CM

## 2021-06-19 DIAGNOSIS — F1120 Opioid dependence, uncomplicated: Secondary | ICD-10-CM | POA: Diagnosis not present

## 2021-06-21 LAB — CUP PACEART REMOTE DEVICE CHECK
Date Time Interrogation Session: 20220630011527
Implantable Pulse Generator Implant Date: 20200728

## 2021-06-26 DIAGNOSIS — F1120 Opioid dependence, uncomplicated: Secondary | ICD-10-CM | POA: Diagnosis not present

## 2021-06-27 ENCOUNTER — Other Ambulatory Visit: Payer: Self-pay

## 2021-06-27 ENCOUNTER — Encounter: Payer: Self-pay | Admitting: Family Medicine

## 2021-06-27 ENCOUNTER — Ambulatory Visit (INDEPENDENT_AMBULATORY_CARE_PROVIDER_SITE_OTHER): Payer: Medicare HMO

## 2021-06-27 ENCOUNTER — Ambulatory Visit (INDEPENDENT_AMBULATORY_CARE_PROVIDER_SITE_OTHER): Payer: Medicare HMO | Admitting: Family Medicine

## 2021-06-27 VITALS — BP 118/83 | HR 79 | Ht 72.0 in | Wt 276.0 lb

## 2021-06-27 DIAGNOSIS — M545 Low back pain, unspecified: Secondary | ICD-10-CM | POA: Diagnosis not present

## 2021-06-27 DIAGNOSIS — Z23 Encounter for immunization: Secondary | ICD-10-CM

## 2021-06-27 DIAGNOSIS — G8929 Other chronic pain: Secondary | ICD-10-CM

## 2021-06-27 DIAGNOSIS — R29898 Other symptoms and signs involving the musculoskeletal system: Secondary | ICD-10-CM | POA: Diagnosis not present

## 2021-06-27 MED ORDER — GABAPENTIN 100 MG PO CAPS: 100.0000 mg | ORAL_CAPSULE | Freq: Every day | ORAL | 0 refills | Status: DC

## 2021-06-27 NOTE — Progress Notes (Deleted)
hy

## 2021-06-27 NOTE — Patient Instructions (Addendum)
It was great to see you!  Things we discussed at today's visit: - It will be very important for you to start physical therapy.  - I will look into arranging a personal care aide for you.  - We will be in touch in the coming days about the logistics of these things.  Take care and seek immediate care sooner if you develop any concerns.  Dr. [facility]

## 2021-06-27 NOTE — Progress Notes (Signed)
    SUBJECTIVE:   CHIEF COMPLAINT / HPI:   Chronic Low Back Pain Patient here to follow up on his chronic low back pain. He also has chronic left leg weakness. Feels like his pain and L leg weakness are getting worse. Reports he is having more difficulty walking, bathing, and putting on socks/shoes. Had a fall last month. Patient requesting referral to have an aide at home. No urinary issues, weight loss, or other red flag symptoms.  MRI Lumbar Spine in 2017 showed: L2-3: Mild disc degeneration and disc bulging. Mild facet  degeneration without significant spinal stenosis.  L3-4: Diffuse disc bulging and facet degeneration. L4-5: Diffuse disc bulging and endplate spurring. Bilateral facet hypertrophy. Mild spinal stenosis. Mild subarticular narrowing on the right.  L5-S1: Disc degeneration with diffuse endplate spurring.  Lumbar x-ray in April 2022 showed moderate disc space narrowing at L3-44, L4-5, and L5-S1.  PERTINENT  PMH / PSH: CVA, Hep C, HLD, opioid use disorder (on methadone)  OBJECTIVE:   BP 118/83   Pulse 79   Ht 6' (1.829 m)   Wt 276 lb (125.2 kg)   SpO2 95%   BMI 37.43 kg/m   General: NAD, pleasant, able to participate in exam Cardiac: RRR, S1 S2 present. normal heart sounds, no murmurs. Respiratory: CTAB, normal effort, No wheezes, rales or rhonchi Skin: warm and dry, no rashes noted Psych: Normal affect and mood Back: No bony tenderness, no step-offs or deformities, no paraspinal muscle tenderness. Extension limited secondary to pain, normal flexion and rotation. 4/5 strength in L lower extremity at the level of the hip, negative straight leg raise Neuro: alert, antalgic gait (walks with cane), sensation intact in all extremities  ASSESSMENT/PLAN:   Low back pain Chronic, worsening. Associated with chronic left leg weakness. Prior imaging shows degenerative disease as well as spinal stenosis. Patient has trialed many medications in the past without significant  improvement. No red flag symptoms. No indication for repeat imaging today. -PT referral placed at last visit. Will follow-up on status of this referral -Patient would benefit from home health PT and home health aide. Referral placed. -Refills provided for Gabapentin -Continue methadone per his methadone clinic    Maury Dus, MD [facility]

## 2021-06-28 NOTE — Assessment & Plan Note (Addendum)
Chronic, worsening. Associated with chronic left leg weakness. Prior imaging shows degenerative disease as well as spinal stenosis. Patient has trialed many medications in the past without significant improvement. No red flag symptoms. No indication for repeat imaging today. -PT referral placed at last visit. Will follow-up on status of this referral -Patient would benefit from home health PT and home health aide. Referral placed. -Refills provided for Gabapentin -Continue methadone per his methadone clinic

## 2021-07-02 DIAGNOSIS — Z7982 Long term (current) use of aspirin: Secondary | ICD-10-CM | POA: Diagnosis not present

## 2021-07-02 DIAGNOSIS — Z8673 Personal history of transient ischemic attack (TIA), and cerebral infarction without residual deficits: Secondary | ICD-10-CM | POA: Diagnosis not present

## 2021-07-02 DIAGNOSIS — G8929 Other chronic pain: Secondary | ICD-10-CM | POA: Diagnosis not present

## 2021-07-02 DIAGNOSIS — F32A Depression, unspecified: Secondary | ICD-10-CM | POA: Diagnosis not present

## 2021-07-02 DIAGNOSIS — B182 Chronic viral hepatitis C: Secondary | ICD-10-CM | POA: Diagnosis not present

## 2021-07-02 DIAGNOSIS — M48061 Spinal stenosis, lumbar region without neurogenic claudication: Secondary | ICD-10-CM | POA: Diagnosis not present

## 2021-07-02 DIAGNOSIS — Z9181 History of falling: Secondary | ICD-10-CM | POA: Diagnosis not present

## 2021-07-02 DIAGNOSIS — E785 Hyperlipidemia, unspecified: Secondary | ICD-10-CM | POA: Diagnosis not present

## 2021-07-03 DIAGNOSIS — F1120 Opioid dependence, uncomplicated: Secondary | ICD-10-CM | POA: Diagnosis not present

## 2021-07-03 NOTE — Progress Notes (Signed)
Carelink Summary Report / Loop Recorder 

## 2021-07-04 ENCOUNTER — Other Ambulatory Visit: Payer: Self-pay

## 2021-07-04 DIAGNOSIS — M545 Low back pain, unspecified: Secondary | ICD-10-CM

## 2021-07-04 DIAGNOSIS — E039 Hypothyroidism, unspecified: Secondary | ICD-10-CM

## 2021-07-04 DIAGNOSIS — G8929 Other chronic pain: Secondary | ICD-10-CM

## 2021-07-05 MED ORDER — AMITRIPTYLINE HCL 25 MG PO TABS: 25.0000 mg | ORAL_TABLET | Freq: Every day | ORAL | 0 refills | Status: DC

## 2021-07-05 MED ORDER — LEVOTHYROXINE SODIUM 112 MCG PO TABS
224.0000 ug | ORAL_TABLET | Freq: Every day | ORAL | 0 refills | Status: DC
Start: 2021-07-05 — End: 2021-10-26

## 2021-07-06 ENCOUNTER — Ambulatory Visit (INDEPENDENT_AMBULATORY_CARE_PROVIDER_SITE_OTHER): Payer: Medicare HMO | Admitting: Family Medicine

## 2021-07-06 ENCOUNTER — Other Ambulatory Visit: Payer: Self-pay

## 2021-07-06 VITALS — BP 116/85 | HR 81 | Ht 72.0 in | Wt 277.4 lb

## 2021-07-06 DIAGNOSIS — E785 Hyperlipidemia, unspecified: Secondary | ICD-10-CM | POA: Diagnosis not present

## 2021-07-06 DIAGNOSIS — F32A Depression, unspecified: Secondary | ICD-10-CM | POA: Diagnosis not present

## 2021-07-06 DIAGNOSIS — M48061 Spinal stenosis, lumbar region without neurogenic claudication: Secondary | ICD-10-CM | POA: Diagnosis not present

## 2021-07-06 DIAGNOSIS — Z8673 Personal history of transient ischemic attack (TIA), and cerebral infarction without residual deficits: Secondary | ICD-10-CM | POA: Diagnosis not present

## 2021-07-06 DIAGNOSIS — Z9181 History of falling: Secondary | ICD-10-CM | POA: Diagnosis not present

## 2021-07-06 DIAGNOSIS — Z7982 Long term (current) use of aspirin: Secondary | ICD-10-CM | POA: Diagnosis not present

## 2021-07-06 DIAGNOSIS — N529 Male erectile dysfunction, unspecified: Secondary | ICD-10-CM

## 2021-07-06 DIAGNOSIS — G8929 Other chronic pain: Secondary | ICD-10-CM | POA: Diagnosis not present

## 2021-07-06 DIAGNOSIS — B182 Chronic viral hepatitis C: Secondary | ICD-10-CM | POA: Diagnosis not present

## 2021-07-06 MED ORDER — SILDENAFIL CITRATE 50 MG PO TABS: 50.0000 mg | ORAL_TABLET | Freq: Every day | ORAL | 0 refills | Status: DC | PRN

## 2021-07-06 NOTE — Patient Instructions (Addendum)
It was great to see you!  Things we discussed at today's visit: - For your erectile dysfunction, I have prescribed sildenafil (Viagra). With the coupon from [employer], it should be affordable ($8-10). - Take one pill 60 minutes prior to sexual activity. - If you do not have any improvement, we will consider a referral to urology in the future.  Take care and seek immediate care sooner if you develop any concerns.  Dr. [facility]

## 2021-07-06 NOTE — Progress Notes (Signed)
    SUBJECTIVE:   CHIEF COMPLAINT / HPI:   Erectile Dysfunction Patient reports difficulty obtaining an erection. States this has been an issue for at least 3 years. Attempting sexual activity 2-3x/week. No nocturnal tumescence. He was given a prescription for Viagra/Cialis in the past but never picked it up due to cost.   PERTINENT  PMH / PSH: CVA, chronic hep C, hypothyroidism, chronic back pain on methadone  OBJECTIVE:   BP 116/85   Pulse 81   Ht 6' (1.829 m)   Wt 277 lb 6.4 oz (125.8 kg)   SpO2 98%   BMI 37.62 kg/m   General: NAD, pleasant, able to participate in exam Respiratory: No respiratory distress Skin: warm and dry, no rashes noted Psych: Normal affect and mood  ASSESSMENT/PLAN:   Erectile dysfunction Patient with several factors that may be contributing to his ED- prior CVA (known vascular disease), hypothyroidism, obesity. Does not appear to be a psychosexual component. No contraindication to PDE-5 inhibitors. Will trial Sildenafil ($8 with [employer] coupon).  If suboptimal response to Sildenafil, will check testosterone levels in the future and consider urology referral.    Maury Dus, MD [facility]

## 2021-07-07 NOTE — Assessment & Plan Note (Addendum)
Patient with several factors that may be contributing to his ED- prior CVA (known vascular disease), hypothyroidism, obesity. Does not appear to be a psychosexual component. No contraindication to PDE-5 inhibitors. Will trial Sildenafil ($8 with [employer] coupon).  If suboptimal response to Sildenafil, will check testosterone levels in the future and consider urology referral.

## 2021-07-10 DIAGNOSIS — F1120 Opioid dependence, uncomplicated: Secondary | ICD-10-CM | POA: Diagnosis not present

## 2021-07-12 IMAGING — CR DG LUMBAR SPINE COMPLETE 4+V
5 series · 5 of 5 positions shown · non-contrast
Comparison: Lumbar MRI May 17, 2016

CLINICAL DATA: Low back pain

EXAM:
LUMBAR SPINE - COMPLETE 4+ VIEW

[t lumbar spine ap]
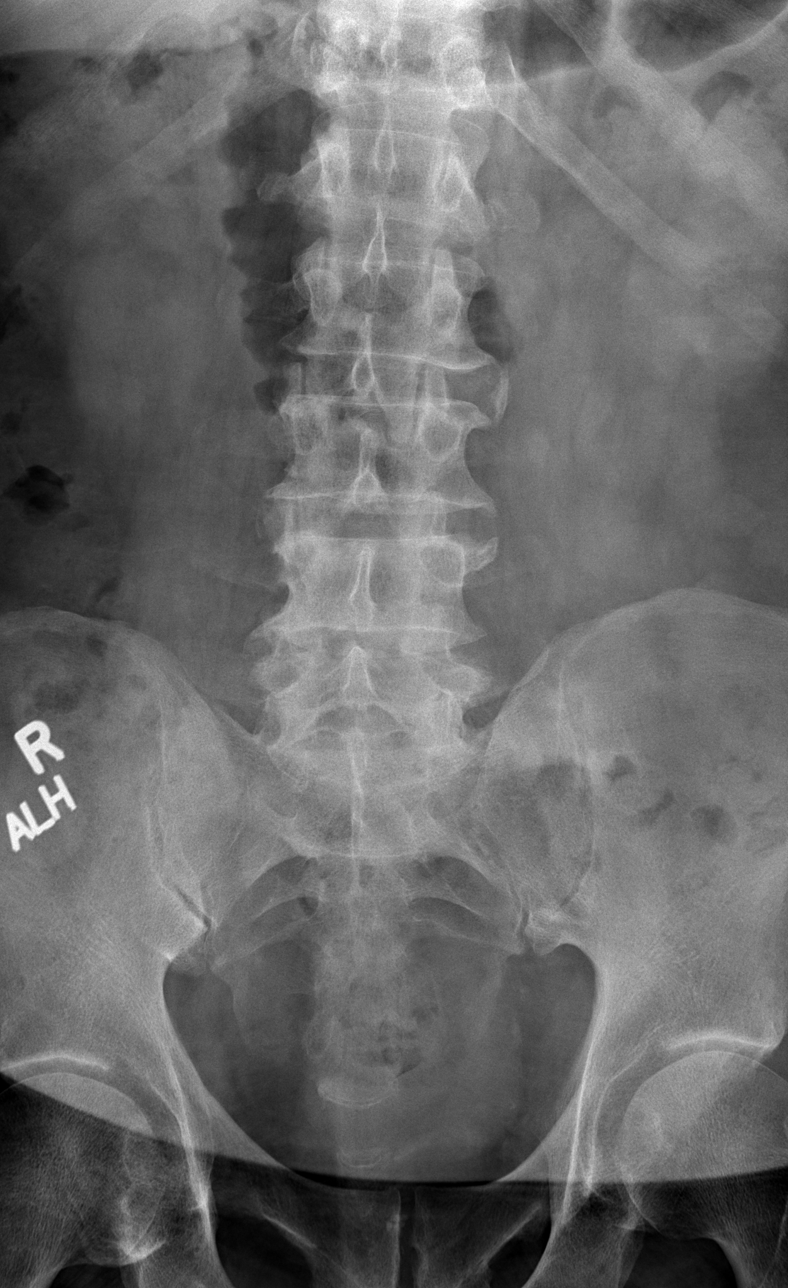

[t lumbar spine obl (1 of 2)]
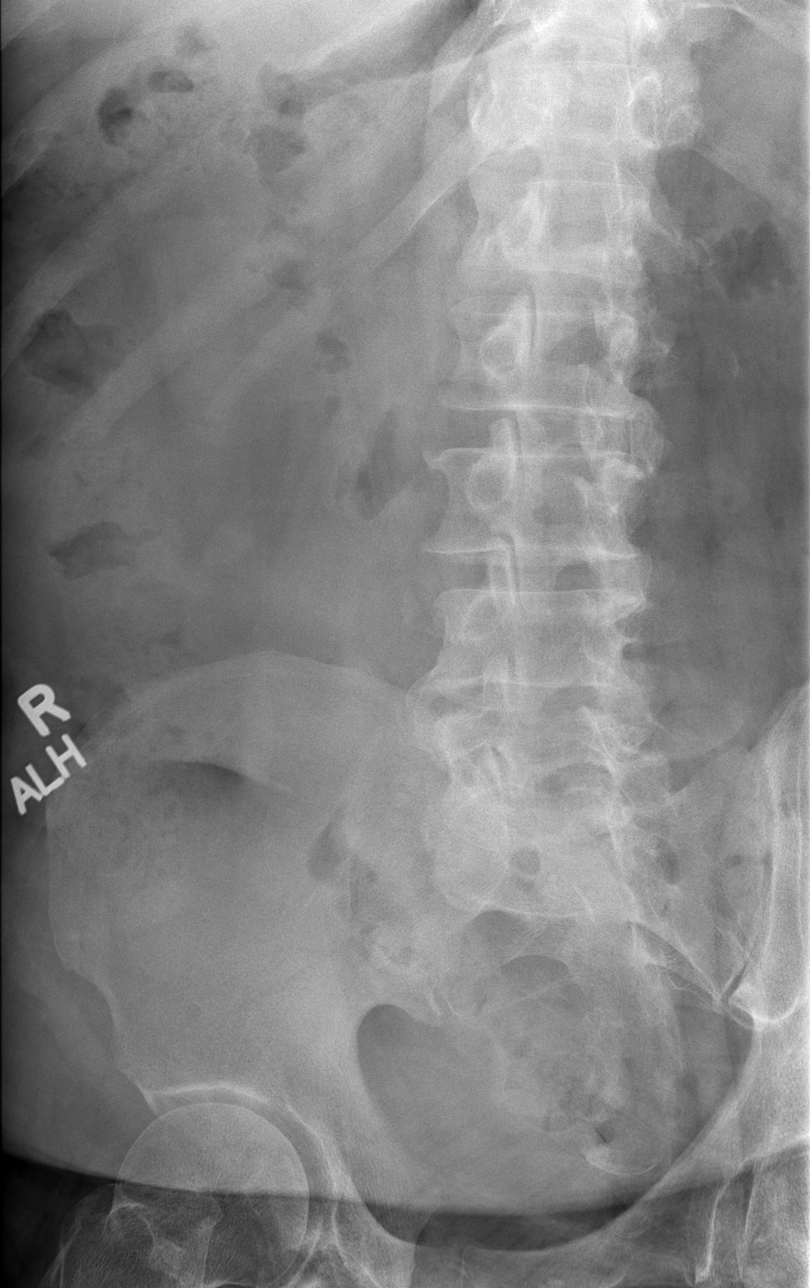

[t lumbar spine obl (2 of 2)]
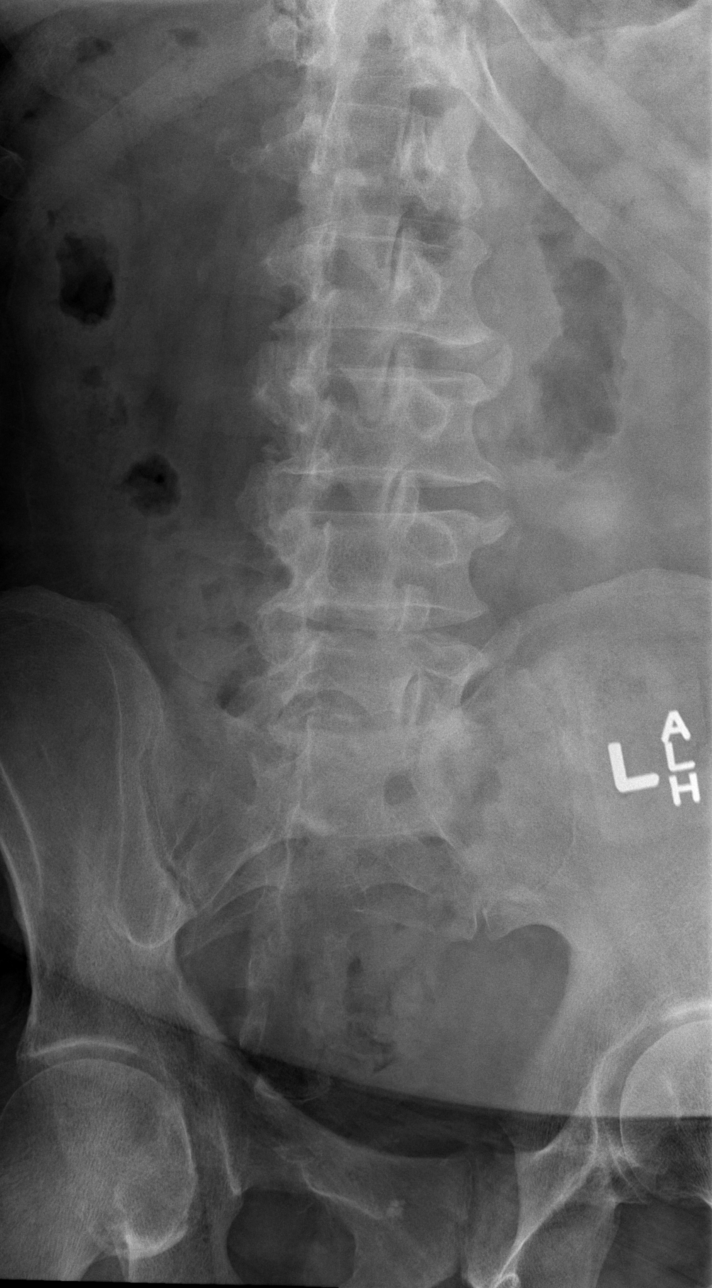

[t lumbar spine lat]
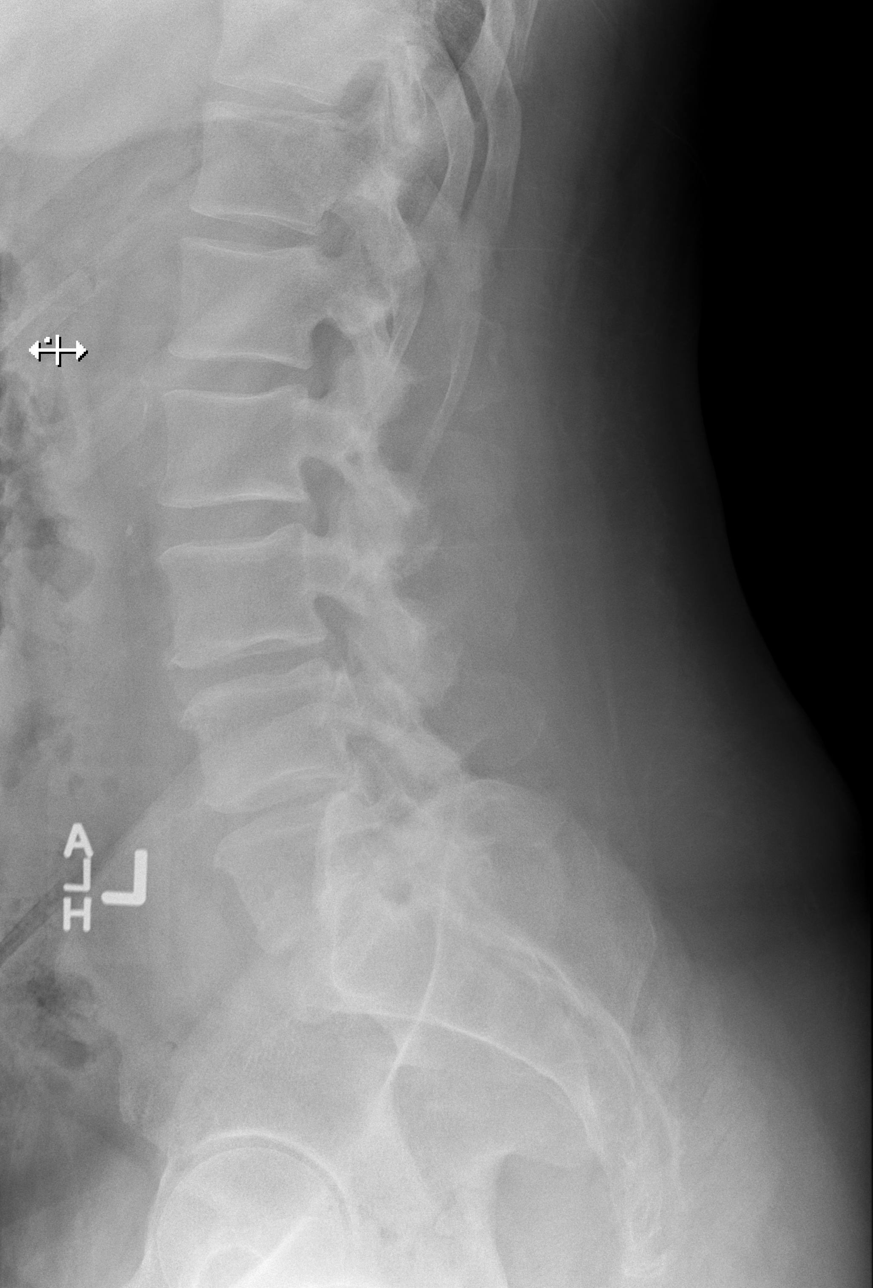

[t lumbar l-5 s-1 spot]
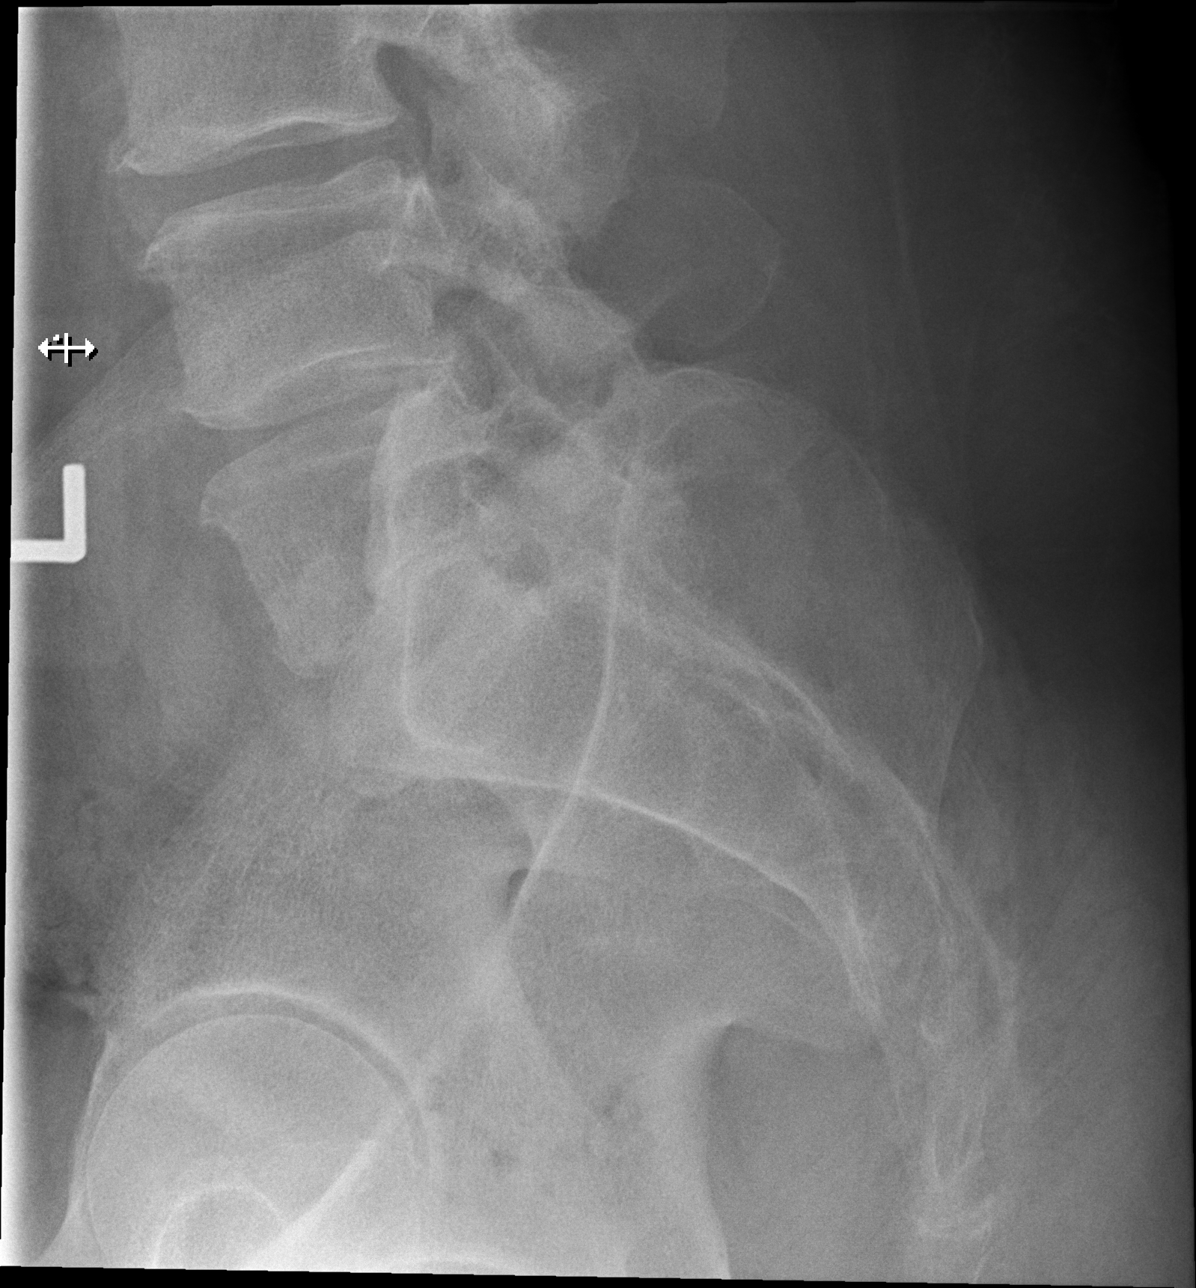

[5 of 5 positions shown; findings below may reference images not displayed]

FINDINGS: Frontal, lateral, spot lumbosacral lateral, and bilateral oblique
views were obtained. There are 5 non-rib-bearing lumbar type
vertebral bodies. There is no fracture or spondylolisthesis. There
is moderate disc space narrowing at L3-4, L4-5, and L5-S1. There is
no appreciable facet arthropathy.
IMPRESSION: Disc space narrowing at L3-4, L4-5, and L5-S1. no fracture or
spondylolisthesis.

## 2021-07-17 DIAGNOSIS — F1120 Opioid dependence, uncomplicated: Secondary | ICD-10-CM | POA: Diagnosis not present

## 2021-07-21 DIAGNOSIS — G8929 Other chronic pain: Secondary | ICD-10-CM | POA: Diagnosis not present

## 2021-07-21 DIAGNOSIS — Z9181 History of falling: Secondary | ICD-10-CM | POA: Diagnosis not present

## 2021-07-21 DIAGNOSIS — M48061 Spinal stenosis, lumbar region without neurogenic claudication: Secondary | ICD-10-CM | POA: Diagnosis not present

## 2021-07-21 DIAGNOSIS — Z8673 Personal history of transient ischemic attack (TIA), and cerebral infarction without residual deficits: Secondary | ICD-10-CM | POA: Diagnosis not present

## 2021-07-21 DIAGNOSIS — F32A Depression, unspecified: Secondary | ICD-10-CM | POA: Diagnosis not present

## 2021-07-21 DIAGNOSIS — Z7982 Long term (current) use of aspirin: Secondary | ICD-10-CM | POA: Diagnosis not present

## 2021-07-21 DIAGNOSIS — B182 Chronic viral hepatitis C: Secondary | ICD-10-CM | POA: Diagnosis not present

## 2021-07-21 DIAGNOSIS — E785 Hyperlipidemia, unspecified: Secondary | ICD-10-CM | POA: Diagnosis not present

## 2021-07-24 ENCOUNTER — Ambulatory Visit (INDEPENDENT_AMBULATORY_CARE_PROVIDER_SITE_OTHER): Payer: Medicare HMO

## 2021-07-24 DIAGNOSIS — I63422 Cerebral infarction due to embolism of left anterior cerebral artery: Secondary | ICD-10-CM | POA: Diagnosis not present

## 2021-07-24 DIAGNOSIS — F1120 Opioid dependence, uncomplicated: Secondary | ICD-10-CM | POA: Diagnosis not present

## 2021-07-24 LAB — CUP PACEART REMOTE DEVICE CHECK
Date Time Interrogation Session: 20220802011945
Implantable Pulse Generator Implant Date: 20200728

## 2021-07-25 DIAGNOSIS — F1120 Opioid dependence, uncomplicated: Secondary | ICD-10-CM | POA: Diagnosis not present

## 2021-07-31 DIAGNOSIS — F1120 Opioid dependence, uncomplicated: Secondary | ICD-10-CM | POA: Diagnosis not present

## 2021-08-08 DIAGNOSIS — F1120 Opioid dependence, uncomplicated: Secondary | ICD-10-CM | POA: Diagnosis not present

## 2021-08-14 DIAGNOSIS — F1120 Opioid dependence, uncomplicated: Secondary | ICD-10-CM | POA: Diagnosis not present

## 2021-08-18 NOTE — Progress Notes (Signed)
Carelink Summary Report / Loop Recorder 

## 2021-08-22 DIAGNOSIS — F1120 Opioid dependence, uncomplicated: Secondary | ICD-10-CM | POA: Diagnosis not present

## 2021-08-24 ENCOUNTER — Ambulatory Visit (INDEPENDENT_AMBULATORY_CARE_PROVIDER_SITE_OTHER): Payer: Medicare HMO

## 2021-08-24 DIAGNOSIS — I639 Cerebral infarction, unspecified: Secondary | ICD-10-CM

## 2021-08-28 LAB — CUP PACEART REMOTE DEVICE CHECK
Date Time Interrogation Session: 20220904012128
Implantable Pulse Generator Implant Date: 20200728

## 2021-08-29 DIAGNOSIS — F1120 Opioid dependence, uncomplicated: Secondary | ICD-10-CM | POA: Diagnosis not present

## 2021-09-04 ENCOUNTER — Ambulatory Visit (INDEPENDENT_AMBULATORY_CARE_PROVIDER_SITE_OTHER): Payer: Medicare HMO | Admitting: Family Medicine

## 2021-09-04 ENCOUNTER — Other Ambulatory Visit: Payer: Self-pay

## 2021-09-04 VITALS — BP 143/87 | HR 79 | Ht 72.0 in | Wt 286.2 lb

## 2021-09-04 DIAGNOSIS — M545 Low back pain, unspecified: Secondary | ICD-10-CM

## 2021-09-04 DIAGNOSIS — J069 Acute upper respiratory infection, unspecified: Secondary | ICD-10-CM | POA: Diagnosis not present

## 2021-09-04 DIAGNOSIS — Z23 Encounter for immunization: Secondary | ICD-10-CM | POA: Diagnosis not present

## 2021-09-04 DIAGNOSIS — G8929 Other chronic pain: Secondary | ICD-10-CM

## 2021-09-04 NOTE — Progress Notes (Signed)
    SUBJECTIVE:   CHIEF COMPLAINT / HPI:   Chronic back pain Patient presents for further discussion regarding his chronic back pain. He states that the pain is still affecting his ambulation but notes that the pain has not worsened since his last visit in July with his PCP. He is hesitant to do anything that is "not necessary" at this time and wants to know what he needs to do to improve his condition such as weight loss. Patient has not worked much with PT as he does not feel that the exercises were enough and he was "doing more on my own" than with them. Does note he has done 2-3 sessions with PT since his last visit.   Cold exposure Patient states that for the last 3-4 days he has had sinus pressure/congestion initially which turned into post-nasal drip and now a cough that has settled in his chest. He has not had any fevers, sore throat, or myalgias.  He has been using Mucinex with improvement.  PERTINENT  PMH / PSH: Reviewed  OBJECTIVE:   BP (!) 143/87   Pulse 79   Ht 6' (1.829 m)   Wt 286 lb 3.2 oz (129.8 kg)   SpO2 100%   BMI 38.82 kg/m   Gen: well-appearing, NAD HEENT: throat without erythema, no sinus pressure on palpation CV: RRR, no m/r/g appreciated, no peripheral edema Pulm: CTAB, no wheezes/crackles Back: left lumbar pain to palpation, antalgic gait with limp noted on the left leg, ROM of left leg limited secondary to pain.    ASSESSMENT/PLAN:   Low back pain Chronic, stable since last visit.  Associated with chronic left leg weakness.  No red flag symptoms today and no indication for repeat imaging, which was extensively discussed with the patient.  Patient is intermittently worked with physical therapy.  Discussed at length that there is not much our clinic can do in regards to further treatment given his current medications and previously tried medications.  Patient agreeable to discussing with his wife about considering a referral to orthopedics for further  evaluation/treatment options. - Encouraged to continue with physical therapy - Continue methadone per methadone clinic - Can consider orthopedic referral, patient will notify our clinic if he wishes to do so.   Viral URI Patient with cold symptoms that initially started out as more of a sinusitis picture.  Patient does not have any pressure or any concerns on physical exam for sinus infection at this time.  Patient has been afebrile and do not feel strongly about COVID testing at this time.  Symptoms overall well controlled on Mucinex and lung sounds reassuring without any consolidation. - Continue Mucinex and supportive care - Patient to follow-up if continued for over 10 days or if worsening symptoms  Kennard Fildes, DO [facility]

## 2021-09-04 NOTE — Patient Instructions (Signed)
For your back pain our main options right now are to work with physical therapy and/or consider talking with a specialist. I do not feel that imaging at this time will change our management or what we can do or offer. If you want to talk with a specialist, we can put in a referral and you can consider if you want to go that route of further investigation with other options for treatment.  For your cold, Mucinex is fine to use. Your lungs sound clear, which is great. If this continues for >10 days touch base with our office again.  For your other conditions, please follow-up with your PCP for further lab work.

## 2021-09-04 NOTE — Assessment & Plan Note (Signed)
Chronic, stable since last visit.  Associated with chronic left leg weakness.  No red flag symptoms today and no indication for repeat imaging, which was extensively discussed with the patient.  Patient is intermittently worked with physical therapy.  Discussed at length that there is not much our clinic can do in regards to further treatment given his current medications and previously tried medications.  Patient agreeable to discussing with his wife about considering a referral to orthopedics for further evaluation/treatment options. - Encouraged to continue with physical therapy - Continue methadone per methadone clinic - Can consider orthopedic referral, patient will notify our clinic if he wishes to do so.

## 2021-09-05 DIAGNOSIS — F1120 Opioid dependence, uncomplicated: Secondary | ICD-10-CM | POA: Diagnosis not present

## 2021-09-05 NOTE — Progress Notes (Signed)
Carelink Summary Report / Loop Recorder 

## 2021-09-07 DIAGNOSIS — F1120 Opioid dependence, uncomplicated: Secondary | ICD-10-CM | POA: Diagnosis not present

## 2021-09-12 DIAGNOSIS — F1120 Opioid dependence, uncomplicated: Secondary | ICD-10-CM | POA: Diagnosis not present

## 2021-09-19 DIAGNOSIS — F1120 Opioid dependence, uncomplicated: Secondary | ICD-10-CM | POA: Diagnosis not present

## 2021-09-24 ENCOUNTER — Ambulatory Visit (INDEPENDENT_AMBULATORY_CARE_PROVIDER_SITE_OTHER): Payer: Medicare HMO

## 2021-09-24 DIAGNOSIS — I639 Cerebral infarction, unspecified: Secondary | ICD-10-CM

## 2021-09-25 ENCOUNTER — Ambulatory Visit: Payer: Medicare HMO | Admitting: Family Medicine

## 2021-09-25 NOTE — Progress Notes (Deleted)
    SUBJECTIVE:   CHIEF COMPLAINT / HPI:   ***  HM Colonoscopy Shingrix  PERTINENT  PMH / PSH: ***  OBJECTIVE:   There were no vitals taken for this visit.  ***  ASSESSMENT/PLAN:   No problem-specific Assessment & Plan notes found for this encounter.     Maury Dus, MD [facility]

## 2021-09-26 DIAGNOSIS — F1120 Opioid dependence, uncomplicated: Secondary | ICD-10-CM | POA: Diagnosis not present

## 2021-09-28 DIAGNOSIS — F1120 Opioid dependence, uncomplicated: Secondary | ICD-10-CM | POA: Diagnosis not present

## 2021-10-01 LAB — CUP PACEART REMOTE DEVICE CHECK
Date Time Interrogation Session: 20221007022220
Implantable Pulse Generator Implant Date: 20200728

## 2021-10-02 NOTE — Progress Notes (Signed)
Carelink Summary Report / Loop Recorder 

## 2021-10-03 DIAGNOSIS — F1120 Opioid dependence, uncomplicated: Secondary | ICD-10-CM | POA: Diagnosis not present

## 2021-10-08 ENCOUNTER — Other Ambulatory Visit: Payer: Self-pay | Admitting: Family Medicine

## 2021-10-08 DIAGNOSIS — G8929 Other chronic pain: Secondary | ICD-10-CM

## 2021-10-08 DIAGNOSIS — M545 Low back pain, unspecified: Secondary | ICD-10-CM

## 2021-10-08 DIAGNOSIS — E039 Hypothyroidism, unspecified: Secondary | ICD-10-CM

## 2021-10-09 DIAGNOSIS — F1120 Opioid dependence, uncomplicated: Secondary | ICD-10-CM | POA: Diagnosis not present

## 2021-10-10 DIAGNOSIS — F1120 Opioid dependence, uncomplicated: Secondary | ICD-10-CM | POA: Diagnosis not present

## 2021-10-15 ENCOUNTER — Ambulatory Visit: Payer: Medicare HMO | Admitting: Family Medicine

## 2021-10-17 DIAGNOSIS — F1120 Opioid dependence, uncomplicated: Secondary | ICD-10-CM | POA: Diagnosis not present

## 2021-10-20 ENCOUNTER — Telehealth: Payer: Self-pay | Admitting: Family Medicine

## 2021-10-20 NOTE — Telephone Encounter (Signed)
Patient no-showed his appointment on 10/4 and then again on 10/24.  He was contacted via telephone and reminded of our clinic's no-show policy. If patient no-shows his next appointment, he will be dismissed from the practice. He verbalized understanding and thanked me for calling to inform him. Letter sent via certified mail with this information as well.

## 2021-10-20 NOTE — Progress Notes (Unsigned)
Patient no-showed his appointments on 10/4 and then again on 10/24.  He was contacted via telephone and reminded of our clinic's no-show policy. If patient no-shows his next appointment, he will be dismissed from the practice. He verbalized understanding and thanked me for calling to inform him. Letter sent via certified mail with this information as well.

## 2021-10-24 DIAGNOSIS — F1120 Opioid dependence, uncomplicated: Secondary | ICD-10-CM | POA: Diagnosis not present

## 2021-10-25 ENCOUNTER — Ambulatory Visit: Payer: Medicare HMO

## 2021-10-26 DIAGNOSIS — F1120 Opioid dependence, uncomplicated: Secondary | ICD-10-CM | POA: Diagnosis not present

## 2021-10-31 DIAGNOSIS — F1120 Opioid dependence, uncomplicated: Secondary | ICD-10-CM | POA: Diagnosis not present

## 2021-10-31 LAB — CUP PACEART REMOTE DEVICE CHECK
Date Time Interrogation Session: 20221109012152
Implantable Pulse Generator Implant Date: 20200728

## 2021-11-14 DIAGNOSIS — F1120 Opioid dependence, uncomplicated: Secondary | ICD-10-CM | POA: Diagnosis not present

## 2021-11-20 ENCOUNTER — Ambulatory Visit (INDEPENDENT_AMBULATORY_CARE_PROVIDER_SITE_OTHER): Payer: Medicare HMO | Admitting: Family Medicine

## 2021-11-20 ENCOUNTER — Other Ambulatory Visit: Payer: Self-pay

## 2021-11-20 ENCOUNTER — Encounter: Payer: Self-pay | Admitting: Family Medicine

## 2021-11-20 VITALS — BP 115/87 | HR 81 | Ht 72.0 in | Wt 289.0 lb

## 2021-11-20 DIAGNOSIS — E039 Hypothyroidism, unspecified: Secondary | ICD-10-CM | POA: Diagnosis not present

## 2021-11-20 DIAGNOSIS — M545 Low back pain, unspecified: Secondary | ICD-10-CM | POA: Diagnosis not present

## 2021-11-20 DIAGNOSIS — G8929 Other chronic pain: Secondary | ICD-10-CM | POA: Diagnosis not present

## 2021-11-20 MED ORDER — GABAPENTIN 100 MG PO CAPS: 100.0000 mg | ORAL_CAPSULE | Freq: Every day | ORAL | 0 refills | Status: DC

## 2021-11-20 MED ORDER — AMITRIPTYLINE HCL 25 MG PO TABS: 25.0000 mg | ORAL_TABLET | Freq: Every day | ORAL | 0 refills | Status: DC

## 2021-11-20 NOTE — Progress Notes (Signed)
    SUBJECTIVE:   CHIEF COMPLAINT / HPI:   Back Pain Patient here to discuss his back pain. This is a longstanding, chronic issue that has been present for several years. He feels it has been gradually worsening. Pain is located across his lower back. It's worse with bending forward and worse when standing, even for very short periods. Reports he usually feels ok when he's sitting. Feels this has limited his function and his ability to lose weight. Associated with chronic L leg weakness but there are no red flag symptoms.  He's on methadone 60mg  daily and has tried various other medications in the past. He's currently prescribed Gabapentin 100mg  at bedtime and amitriptyline 25mg  daily. Has poor compliance and reports he only takes these as needed.  Patient also did a very limited amount of physical therapy (refused several sessions) and did not find it helpful. Not interested in trying again. He would like to see the specialist at this point, which was discussed as an option at his last visit.   MRI Lumbar Spine in 2017 showed: L2-3: Mild disc degeneration and disc bulging. Mild facet  degeneration without significant spinal stenosis.  L3-4: Diffuse disc bulging and facet degeneration. L4-5: Diffuse disc bulging and endplate spurring. Bilateral facet hypertrophy. Mild spinal stenosis. Mild subarticular narrowing on the right.  L5-S1: Disc degeneration with diffuse endplate spurring.   PERTINENT  PMH / PSH: CVA, hypothyroidism, HTN, erectile dysfunction, HLD  OBJECTIVE:   BP 115/87   Pulse 81   Ht 6' (1.829 m)   Wt 289 lb (131.1 kg)   SpO2 99%   BMI 39.20 kg/m   General: NAD, obese, able to participate in exam Respiratory: No respiratory distress Skin: warm and dry, no rashes noted Psych: Normal affect and mood Neuro: no obvious focal deficits Back: mild exaggeration of normal lumbar lordosis, no midline bony tenderness, minimal paraspinal muscle tenderness in lumbar region  bilaterally, full ROM with flexion, extension and rotation although flexion elicits some pain. Gross sensation intact in bilateral lower extremities. Slightly antalgic gait (walks with cane) due to L leg weakness.  ASSESSMENT/PLAN:   Low back pain Chronic. Slightly worse from prior per patient report. No red flag symptoms. MRI in 2017 showed degenerative changes and disc bulging in various lumbar spaces (see HPI above for details). -Continue methadone per pain management specialist -Discussed importance of daily compliance with amitriptyline and gabapentin. Refills provided -Encouraged physical therapy, but patient declined -Discussed benefit of weight loss on his back pain. Patient will contact our office if he's interested in medication to help with weight loss although medication compliance has been an issue in the past -Referral to spine specialist to see if there are any other treatment options outside the scope of primary care   Hypothyroidism Last TSH wnl (1.460) in January 2022. However, prior to that had been poorly controlled due to medication non-adherence. Reports he is taking his Synthroid 2018 daily as prescribed. Denies symptoms of hypothyroidism although he has gained weight per chart review. Patient declines TSH today. Will need to obtain at next visit.    2018, MD [facility]

## 2021-11-20 NOTE — Patient Instructions (Addendum)
It was great to see you!  -I have refilled your Gabapentin and Amitriptyline. These medications are meant to be taken every single day (not just as-needed). -I have placed a referral to the spine specialists. They will call you for an appointment. -I really think physical therapy would be helpful for your back pain. Please let me know if you change your mind and are interested in trying this again. -Weight loss will also be helpful. The [employer] is a great idea. We could also consider medications to help with weight loss if you decide you're interested. -Continue taking your Synthroid daily. We will recheck your thyroid levels at your next appointment.  Take care and seek immediate care sooner if you develop any concerns.  Dr. [facility]

## 2021-11-21 DIAGNOSIS — F1120 Opioid dependence, uncomplicated: Secondary | ICD-10-CM | POA: Diagnosis not present

## 2021-11-22 DIAGNOSIS — F1120 Opioid dependence, uncomplicated: Secondary | ICD-10-CM | POA: Diagnosis not present

## 2021-11-22 NOTE — Assessment & Plan Note (Signed)
Last TSH wnl (1.460) in January 2022. However, prior to that had been poorly controlled due to medication non-adherence. Reports he is taking his Synthroid daily as prescribed. Denies symptoms of hypothyroidism although he has gained weight per chart review. Patient declines TSH today. Will need to obtain at next visit.

## 2021-11-22 NOTE — Assessment & Plan Note (Addendum)
Chronic. Slightly worse from prior per patient report. No red flag symptoms. MRI in 2017 showed degenerative changes and disc bulging in various lumbar spaces (see HPI above for details). -Continue methadone per pain management specialist -Discussed importance of daily compliance with amitriptyline and gabapentin. Refills provided -Encouraged physical therapy, but patient declined -Discussed benefit of weight loss on his back pain. Patient will contact our office if he's interested in medication to help with weight loss although medication compliance has been an issue in the past -Referral to spine specialist to see if there are any other treatment options outside the scope of primary care

## 2021-11-26 ENCOUNTER — Ambulatory Visit (INDEPENDENT_AMBULATORY_CARE_PROVIDER_SITE_OTHER): Payer: Medicare HMO

## 2021-11-26 DIAGNOSIS — I639 Cerebral infarction, unspecified: Secondary | ICD-10-CM | POA: Diagnosis not present

## 2021-11-27 LAB — CUP PACEART REMOTE DEVICE CHECK
Date Time Interrogation Session: 20221204231509
Implantable Pulse Generator Implant Date: 20200728

## 2021-11-28 DIAGNOSIS — F1120 Opioid dependence, uncomplicated: Secondary | ICD-10-CM | POA: Diagnosis not present

## 2021-11-30 ENCOUNTER — Other Ambulatory Visit: Payer: Self-pay | Admitting: Family Medicine

## 2021-11-30 DIAGNOSIS — E785 Hyperlipidemia, unspecified: Secondary | ICD-10-CM

## 2021-12-05 DIAGNOSIS — F1120 Opioid dependence, uncomplicated: Secondary | ICD-10-CM | POA: Diagnosis not present

## 2021-12-05 NOTE — Progress Notes (Signed)
Carelink Summary Report / Loop Recorder 

## 2021-12-12 DIAGNOSIS — F1120 Opioid dependence, uncomplicated: Secondary | ICD-10-CM | POA: Diagnosis not present

## 2021-12-19 DIAGNOSIS — F1120 Opioid dependence, uncomplicated: Secondary | ICD-10-CM | POA: Diagnosis not present

## 2021-12-26 DIAGNOSIS — F1120 Opioid dependence, uncomplicated: Secondary | ICD-10-CM | POA: Diagnosis not present

## 2021-12-28 DIAGNOSIS — F1120 Opioid dependence, uncomplicated: Secondary | ICD-10-CM | POA: Diagnosis not present

## 2022-01-02 DIAGNOSIS — F1120 Opioid dependence, uncomplicated: Secondary | ICD-10-CM | POA: Diagnosis not present

## 2022-01-03 DIAGNOSIS — F1120 Opioid dependence, uncomplicated: Secondary | ICD-10-CM | POA: Diagnosis not present

## 2022-01-04 DIAGNOSIS — F1120 Opioid dependence, uncomplicated: Secondary | ICD-10-CM | POA: Diagnosis not present

## 2022-01-09 DIAGNOSIS — F1120 Opioid dependence, uncomplicated: Secondary | ICD-10-CM | POA: Diagnosis not present

## 2022-01-11 DIAGNOSIS — M48061 Spinal stenosis, lumbar region without neurogenic claudication: Secondary | ICD-10-CM | POA: Diagnosis not present

## 2022-01-11 DIAGNOSIS — F1120 Opioid dependence, uncomplicated: Secondary | ICD-10-CM | POA: Diagnosis not present

## 2022-01-11 DIAGNOSIS — I10 Essential (primary) hypertension: Secondary | ICD-10-CM | POA: Diagnosis not present

## 2022-01-11 DIAGNOSIS — Z6841 Body Mass Index (BMI) 40.0 and over, adult: Secondary | ICD-10-CM | POA: Diagnosis not present

## 2022-01-11 DIAGNOSIS — R030 Elevated blood-pressure reading, without diagnosis of hypertension: Secondary | ICD-10-CM | POA: Diagnosis not present

## 2022-01-11 DIAGNOSIS — G959 Disease of spinal cord, unspecified: Secondary | ICD-10-CM | POA: Diagnosis not present

## 2022-01-14 ENCOUNTER — Other Ambulatory Visit: Payer: Self-pay | Admitting: Neurosurgery

## 2022-01-14 DIAGNOSIS — G959 Disease of spinal cord, unspecified: Secondary | ICD-10-CM

## 2022-01-15 ENCOUNTER — Ambulatory Visit (INDEPENDENT_AMBULATORY_CARE_PROVIDER_SITE_OTHER): Payer: Medicare HMO

## 2022-01-15 DIAGNOSIS — I639 Cerebral infarction, unspecified: Secondary | ICD-10-CM

## 2022-01-15 LAB — CUP PACEART REMOTE DEVICE CHECK
Date Time Interrogation Session: 20230123230847
Implantable Pulse Generator Implant Date: 20200728

## 2022-01-16 DIAGNOSIS — F1120 Opioid dependence, uncomplicated: Secondary | ICD-10-CM | POA: Diagnosis not present

## 2022-01-23 DIAGNOSIS — F1120 Opioid dependence, uncomplicated: Secondary | ICD-10-CM | POA: Diagnosis not present

## 2022-01-24 DIAGNOSIS — F1120 Opioid dependence, uncomplicated: Secondary | ICD-10-CM | POA: Diagnosis not present

## 2022-01-25 DIAGNOSIS — F1120 Opioid dependence, uncomplicated: Secondary | ICD-10-CM | POA: Diagnosis not present

## 2022-01-25 NOTE — Progress Notes (Signed)
Carelink Summary Report / Loop Recorder 

## 2022-01-30 DIAGNOSIS — F1120 Opioid dependence, uncomplicated: Secondary | ICD-10-CM | POA: Diagnosis not present

## 2022-01-31 DIAGNOSIS — F1120 Opioid dependence, uncomplicated: Secondary | ICD-10-CM | POA: Diagnosis not present

## 2022-02-05 ENCOUNTER — Ambulatory Visit
Admission: RE | Admit: 2022-02-05 | Discharge: 2022-02-05 | Disposition: A | Discharge status: Home / Self Care | Payer: Medicare HMO | Source: Ambulatory Visit | Attending: Neurosurgery | Admitting: Neurosurgery

## 2022-02-05 DIAGNOSIS — M542 Cervicalgia: Secondary | ICD-10-CM | POA: Diagnosis not present

## 2022-02-05 DIAGNOSIS — M545 Low back pain, unspecified: Secondary | ICD-10-CM | POA: Diagnosis not present

## 2022-02-05 DIAGNOSIS — M4802 Spinal stenosis, cervical region: Secondary | ICD-10-CM | POA: Diagnosis not present

## 2022-02-05 DIAGNOSIS — G959 Disease of spinal cord, unspecified: Secondary | ICD-10-CM

## 2022-02-05 DIAGNOSIS — M48061 Spinal stenosis, lumbar region without neurogenic claudication: Secondary | ICD-10-CM | POA: Diagnosis not present

## 2022-02-07 DIAGNOSIS — F1120 Opioid dependence, uncomplicated: Secondary | ICD-10-CM | POA: Diagnosis not present

## 2022-02-08 DIAGNOSIS — F1120 Opioid dependence, uncomplicated: Secondary | ICD-10-CM | POA: Diagnosis not present

## 2022-02-14 DIAGNOSIS — F1120 Opioid dependence, uncomplicated: Secondary | ICD-10-CM | POA: Diagnosis not present

## 2022-02-15 DIAGNOSIS — F1120 Opioid dependence, uncomplicated: Secondary | ICD-10-CM | POA: Diagnosis not present

## 2022-02-18 ENCOUNTER — Ambulatory Visit (INDEPENDENT_AMBULATORY_CARE_PROVIDER_SITE_OTHER): Payer: Medicare HMO

## 2022-02-18 ENCOUNTER — Other Ambulatory Visit: Payer: Self-pay | Admitting: Family Medicine

## 2022-02-18 DIAGNOSIS — M48061 Spinal stenosis, lumbar region without neurogenic claudication: Secondary | ICD-10-CM | POA: Diagnosis not present

## 2022-02-18 DIAGNOSIS — G959 Disease of spinal cord, unspecified: Secondary | ICD-10-CM | POA: Diagnosis not present

## 2022-02-18 DIAGNOSIS — E039 Hypothyroidism, unspecified: Secondary | ICD-10-CM

## 2022-02-18 DIAGNOSIS — I639 Cerebral infarction, unspecified: Secondary | ICD-10-CM | POA: Diagnosis not present

## 2022-02-18 LAB — CUP PACEART REMOTE DEVICE CHECK
Date Time Interrogation Session: 20230225230753
Implantable Pulse Generator Implant Date: 20200728

## 2022-02-21 DIAGNOSIS — F1120 Opioid dependence, uncomplicated: Secondary | ICD-10-CM | POA: Diagnosis not present

## 2022-02-25 NOTE — Progress Notes (Signed)
Carelink Summary Report / Loop Recorder 

## 2022-02-28 DIAGNOSIS — F1120 Opioid dependence, uncomplicated: Secondary | ICD-10-CM | POA: Diagnosis not present

## 2022-03-07 DIAGNOSIS — F1120 Opioid dependence, uncomplicated: Secondary | ICD-10-CM | POA: Diagnosis not present

## 2022-03-07 NOTE — Progress Notes (Incomplete)
    SUBJECTIVE:   CHIEF COMPLAINT / HPI:   ***  PERTINENT  PMH / PSH: ***  OBJECTIVE:   There were no vitals taken for this visit.  ***  ASSESSMENT/PLAN:   No problem-specific Assessment & Plan notes found for this encounter.     Jayda White Autry-Lott, DO [facility]   

## 2022-03-07 NOTE — Patient Instructions (Incomplete)
It was wonderful to see you today. ? ?Please bring ALL of your medications with you to every visit.  ? ?Today we talked about: ? ?** ? ?Please be sure to schedule follow up at the front  desk before you leave today.  ? ?If you haven't already, sign up for My Chart to have easy access to your labs results, and communication with your primary care physician. ? ?Please call the clinic at [phone] if your symptoms worsen or you have any concerns. It was our pleasure to serve you. ? ?Dr. Autry-Lott ? ?

## 2022-03-08 ENCOUNTER — Ambulatory Visit: Payer: Medicare HMO | Admitting: Family Medicine

## 2022-03-14 DIAGNOSIS — F1120 Opioid dependence, uncomplicated: Secondary | ICD-10-CM | POA: Diagnosis not present

## 2022-03-15 ENCOUNTER — Ambulatory Visit: Payer: Medicare HMO | Admitting: Student

## 2022-03-21 DIAGNOSIS — F1120 Opioid dependence, uncomplicated: Secondary | ICD-10-CM | POA: Diagnosis not present

## 2022-03-28 DIAGNOSIS — F1120 Opioid dependence, uncomplicated: Secondary | ICD-10-CM | POA: Diagnosis not present

## 2022-04-02 ENCOUNTER — Ambulatory Visit: Payer: Medicare HMO | Admitting: Family Medicine

## 2022-04-02 DIAGNOSIS — F1120 Opioid dependence, uncomplicated: Secondary | ICD-10-CM | POA: Diagnosis not present

## 2022-04-04 DIAGNOSIS — F1120 Opioid dependence, uncomplicated: Secondary | ICD-10-CM | POA: Diagnosis not present

## 2022-04-10 DIAGNOSIS — F1120 Opioid dependence, uncomplicated: Secondary | ICD-10-CM | POA: Diagnosis not present

## 2022-04-14 DIAGNOSIS — F1120 Opioid dependence, uncomplicated: Secondary | ICD-10-CM | POA: Diagnosis not present

## 2022-04-21 DIAGNOSIS — F1120 Opioid dependence, uncomplicated: Secondary | ICD-10-CM | POA: Diagnosis not present

## 2022-04-23 DIAGNOSIS — F1120 Opioid dependence, uncomplicated: Secondary | ICD-10-CM | POA: Diagnosis not present

## 2022-04-24 ENCOUNTER — Ambulatory Visit: Payer: Medicare HMO | Admitting: Family Medicine

## 2022-04-28 DIAGNOSIS — F1120 Opioid dependence, uncomplicated: Secondary | ICD-10-CM | POA: Diagnosis not present

## 2022-05-05 DIAGNOSIS — F1120 Opioid dependence, uncomplicated: Secondary | ICD-10-CM | POA: Diagnosis not present

## 2022-05-13 DIAGNOSIS — F1120 Opioid dependence, uncomplicated: Secondary | ICD-10-CM | POA: Diagnosis not present

## 2022-05-20 DIAGNOSIS — F1120 Opioid dependence, uncomplicated: Secondary | ICD-10-CM | POA: Diagnosis not present

## 2022-05-27 DIAGNOSIS — F1120 Opioid dependence, uncomplicated: Secondary | ICD-10-CM | POA: Diagnosis not present

## 2022-06-03 DIAGNOSIS — F1120 Opioid dependence, uncomplicated: Secondary | ICD-10-CM | POA: Diagnosis not present

## 2022-06-10 DIAGNOSIS — F1120 Opioid dependence, uncomplicated: Secondary | ICD-10-CM | POA: Diagnosis not present

## 2022-06-19 DIAGNOSIS — F1120 Opioid dependence, uncomplicated: Secondary | ICD-10-CM | POA: Diagnosis not present

## 2022-06-20 ENCOUNTER — Telehealth: Payer: Self-pay

## 2022-06-20 NOTE — Telephone Encounter (Signed)
The patient states he lost his monitor while moving. I ordered the patient a new monitor.

## 2022-06-26 DIAGNOSIS — F1120 Opioid dependence, uncomplicated: Secondary | ICD-10-CM | POA: Diagnosis not present

## 2022-06-28 DIAGNOSIS — F1120 Opioid dependence, uncomplicated: Secondary | ICD-10-CM | POA: Diagnosis not present

## 2022-07-03 DIAGNOSIS — F1120 Opioid dependence, uncomplicated: Secondary | ICD-10-CM | POA: Diagnosis not present

## 2022-07-08 DIAGNOSIS — F1120 Opioid dependence, uncomplicated: Secondary | ICD-10-CM | POA: Diagnosis not present

## 2022-07-09 DIAGNOSIS — F1120 Opioid dependence, uncomplicated: Secondary | ICD-10-CM | POA: Diagnosis not present

## 2022-07-10 DIAGNOSIS — F1120 Opioid dependence, uncomplicated: Secondary | ICD-10-CM | POA: Diagnosis not present

## 2022-07-17 DIAGNOSIS — F1120 Opioid dependence, uncomplicated: Secondary | ICD-10-CM | POA: Diagnosis not present

## 2022-07-25 DIAGNOSIS — F1120 Opioid dependence, uncomplicated: Secondary | ICD-10-CM | POA: Diagnosis not present

## 2022-08-01 ENCOUNTER — Other Ambulatory Visit: Payer: Self-pay | Admitting: Nurse Practitioner

## 2022-08-01 DIAGNOSIS — K7400 Hepatic fibrosis, unspecified: Secondary | ICD-10-CM | POA: Diagnosis not present

## 2022-08-01 DIAGNOSIS — K760 Fatty (change of) liver, not elsewhere classified: Secondary | ICD-10-CM | POA: Diagnosis not present

## 2022-08-01 DIAGNOSIS — F1120 Opioid dependence, uncomplicated: Secondary | ICD-10-CM | POA: Diagnosis not present

## 2022-08-02 ENCOUNTER — Ambulatory Visit (INDEPENDENT_AMBULATORY_CARE_PROVIDER_SITE_OTHER): Payer: Medicare HMO

## 2022-08-02 DIAGNOSIS — I639 Cerebral infarction, unspecified: Secondary | ICD-10-CM

## 2022-08-02 LAB — CUP PACEART REMOTE DEVICE CHECK
Date Time Interrogation Session: 20230811003301
Implantable Pulse Generator Implant Date: 20200728

## 2022-08-06 ENCOUNTER — Other Ambulatory Visit: Payer: Medicare HMO

## 2022-08-08 DIAGNOSIS — F1120 Opioid dependence, uncomplicated: Secondary | ICD-10-CM | POA: Diagnosis not present

## 2022-08-15 DIAGNOSIS — F1120 Opioid dependence, uncomplicated: Secondary | ICD-10-CM | POA: Diagnosis not present

## 2022-08-21 ENCOUNTER — Other Ambulatory Visit: Payer: Self-pay

## 2022-08-21 NOTE — Patient Outreach (Signed)
[facility]) Care Management  08/21/2022  Ricky Walter [birth year] [id number]   Telephone call to patient for nurse call.  No answer.  HIPAA compliant voice message left.    Plan: RN CM will attempt again next week.   [name], RN, MSN [facility] Care Management Care Management Coordinator Direct Line [phone] Toll Free: [phone]  Fax: [phone]

## 2022-08-22 DIAGNOSIS — F1120 Opioid dependence, uncomplicated: Secondary | ICD-10-CM | POA: Diagnosis not present

## 2022-08-23 ENCOUNTER — Other Ambulatory Visit: Payer: Self-pay

## 2022-08-23 NOTE — Patient Outreach (Signed)
  Care Coordination   08/23/2022 Name: OVILA LEPAGE MRN: [id number] DOB: 12-11-1956   Care Coordination Outreach Attempts:  An unsuccessful telephone outreach was attempted today to offer the patient information about available care coordination services as a benefit of their health plan.   Follow Up Plan:  Additional outreach attempts will be made to offer the patient care coordination information and services.   Encounter Outcome:  No Answer  Care Coordination Interventions Activated:  No   Care Coordination Interventions:  No, not indicated    [name], RN, MSN [facility] Care Management Care Management Coordinator Direct Line [phone] Toll Free: [phone]  Fax: [phone]

## 2022-08-27 NOTE — Progress Notes (Signed)
Carelink Summary Report / Loop Recorder 

## 2022-08-29 DIAGNOSIS — Z113 Encounter for screening for infections with a predominantly sexual mode of transmission: Secondary | ICD-10-CM | POA: Diagnosis not present

## 2022-08-29 DIAGNOSIS — F1120 Opioid dependence, uncomplicated: Secondary | ICD-10-CM | POA: Diagnosis not present

## 2022-08-30 ENCOUNTER — Telehealth: Payer: Self-pay

## 2022-08-30 NOTE — Patient Outreach (Signed)
  Care Coordination   Initial Visit Note   08/30/2022 Name: Ricky Walter MRN: [id number] DOB: 1956-03-07  Ricky Walter is a 66 y.o. year old male who sees Maury Dus, MD for primary care. I spoke with  Ricky Walter by phone today.  What matters to the patients health and wellness today?  none    Goals Addressed             This Visit's Progress    COMPLETED: Patient to set AWV       Care Coordination Interventions: No follow up required Advised patient to set up annual wellness visit             SDOH assessments and interventions completed:  Yes     Care Coordination Interventions Activated:  Yes  Care Coordination Interventions:  Yes, provided   Follow up plan: No further intervention required.   Encounter Outcome:  Pt. Visit Completed   [name], RN, MSN [facility] Care Management Care Management Coordinator Direct Line [phone] Toll Free: [phone]  Fax: [phone]

## 2022-08-30 NOTE — Patient Instructions (Signed)
Visit Information  Thank you for taking time to visit with me today. Please don't hesitate to contact me if I can be of assistance to you.   Following are the goals we discussed today:   Goals Addressed             This Visit's Progress    COMPLETED: Patient to set AWV       Care Coordination Interventions: No follow up required Advised patient to set up annual wellness visit              Please call the care guide team at [phone] if you need to cancel or reschedule your appointment.   If you are experiencing a Mental Health or Behavioral Health Crisis or need someone to talk to, please call the Suicide and Crisis Lifeline: 988   The patient verbalized understanding of instructions, educational materials, and care plan provided today and agreed to receive a mailed copy of patient instructions, educational materials, and care plan.    [name], RN, MSN [facility] Care Management Care Management Coordinator Direct Line [phone] Toll Free: [phone]  Fax: [phone]

## 2022-09-04 ENCOUNTER — Ambulatory Visit (INDEPENDENT_AMBULATORY_CARE_PROVIDER_SITE_OTHER): Payer: Medicare HMO

## 2022-09-04 DIAGNOSIS — I639 Cerebral infarction, unspecified: Secondary | ICD-10-CM

## 2022-09-05 DIAGNOSIS — F1120 Opioid dependence, uncomplicated: Secondary | ICD-10-CM | POA: Diagnosis not present

## 2022-09-05 LAB — CUP PACEART REMOTE DEVICE CHECK
Date Time Interrogation Session: 20230913003112
Implantable Pulse Generator Implant Date: 20200728

## 2022-09-12 DIAGNOSIS — F1120 Opioid dependence, uncomplicated: Secondary | ICD-10-CM | POA: Diagnosis not present

## 2022-09-19 ENCOUNTER — Ambulatory Visit: Payer: Medicare HMO | Admitting: Family Medicine

## 2022-09-19 DIAGNOSIS — F1120 Opioid dependence, uncomplicated: Secondary | ICD-10-CM | POA: Diagnosis not present

## 2022-09-19 NOTE — Progress Notes (Signed)
Carelink Summary Report / Loop Recorder 

## 2022-09-19 NOTE — Progress Notes (Deleted)
    SUBJECTIVE:   CHIEF COMPLAINT / HPI:   Hypothyroidism Last TSH January 2022. Synthroid has not been refilled in 6 months  PERTINENT  PMH / PSH: ***  OBJECTIVE:   There were no vitals taken for this visit.  ***  ASSESSMENT/PLAN:   No problem-specific Assessment & Plan notes found for this encounter.     Alcus Dad, MD [place]

## 2022-10-03 DIAGNOSIS — F1120 Opioid dependence, uncomplicated: Secondary | ICD-10-CM | POA: Diagnosis not present

## 2022-10-07 ENCOUNTER — Ambulatory Visit (INDEPENDENT_AMBULATORY_CARE_PROVIDER_SITE_OTHER): Payer: Medicare HMO

## 2022-10-07 DIAGNOSIS — I639 Cerebral infarction, unspecified: Secondary | ICD-10-CM

## 2022-10-07 LAB — CUP PACEART REMOTE DEVICE CHECK
Date Time Interrogation Session: 20231016003407
Implantable Pulse Generator Implant Date: 20200728

## 2022-10-10 DIAGNOSIS — F1120 Opioid dependence, uncomplicated: Secondary | ICD-10-CM | POA: Diagnosis not present

## 2022-10-17 ENCOUNTER — Other Ambulatory Visit: Payer: Self-pay

## 2022-10-17 DIAGNOSIS — F1120 Opioid dependence, uncomplicated: Secondary | ICD-10-CM | POA: Diagnosis not present

## 2022-10-17 DIAGNOSIS — E039 Hypothyroidism, unspecified: Secondary | ICD-10-CM

## 2022-10-17 MED ORDER — LEVOTHYROXINE SODIUM 112 MCG PO TABS: 224.0000 ug | ORAL_TABLET | Freq: Every day | ORAL | 0 refills | Status: DC

## 2022-10-24 DIAGNOSIS — F1120 Opioid dependence, uncomplicated: Secondary | ICD-10-CM | POA: Diagnosis not present

## 2022-10-24 NOTE — Progress Notes (Deleted)
    SUBJECTIVE:   CHIEF COMPLAINT / HPI:   Hypothyroidism  Back Pain  Health Maintenance -annual wellness -tdap, shingrix, pcv20, flu -colonoscopy  PERTINENT  PMH / PSH: ***  OBJECTIVE:   There were no vitals taken for this visit.  ***  ASSESSMENT/PLAN:   No problem-specific Assessment & Plan notes found for this encounter.     Alcus Dad, MD [place]

## 2022-10-25 ENCOUNTER — Ambulatory Visit: Payer: Medicare HMO | Admitting: Family Medicine

## 2022-10-29 ENCOUNTER — Ambulatory Visit: Payer: Medicare HMO | Admitting: Family Medicine

## 2022-10-29 NOTE — Progress Notes (Deleted)
    SUBJECTIVE:   CHIEF COMPLAINT / HPI:   HTN - No medications listed in med rec. Patient states he is taking:  Back Pain -   Hypothyroidism - Synthroid   HM - Vaccines, Colonoscopy  PERTINENT  PMH / PSH: Low back pain, Hypothyroidism, Chronic Hep C, HTN, PFO  OBJECTIVE:   There were no vitals taken for this visit.  General: NAD *** Neuro: A&O*** Cardiovascular: RRR, no murmurs, no peripheral edema*** Respiratory: normal WOB on ***, CTAB, no wheezes, ronchi or rales*** Abdomen: soft, NTTP, no rebound or guarding*** Extremities: Moving all 4 extremities equally   ASSESSMENT/PLAN:   Hypothyroidism Last TSH 1 year ago within normal limits, will recheck today.  Patient is currently taking - TSH     Salvadore Oxford, MD [place]

## 2022-10-29 NOTE — Progress Notes (Signed)
Carelink Summary Report / Loop Recorder 

## 2022-10-29 NOTE — Assessment & Plan Note (Deleted)
Last TSH 1 year ago within normal limits, will recheck today.  Patient is currently taking - TSH

## 2022-10-31 DIAGNOSIS — F1120 Opioid dependence, uncomplicated: Secondary | ICD-10-CM | POA: Diagnosis not present

## 2022-11-07 DIAGNOSIS — F1120 Opioid dependence, uncomplicated: Secondary | ICD-10-CM | POA: Diagnosis not present

## 2022-11-11 ENCOUNTER — Ambulatory Visit (INDEPENDENT_AMBULATORY_CARE_PROVIDER_SITE_OTHER): Payer: Medicare HMO

## 2022-11-11 DIAGNOSIS — I639 Cerebral infarction, unspecified: Secondary | ICD-10-CM

## 2022-11-12 ENCOUNTER — Ambulatory Visit: Payer: Medicare HMO | Admitting: Family Medicine

## 2022-11-12 LAB — CUP PACEART REMOTE DEVICE CHECK
Date Time Interrogation Session: 20231119231410
Implantable Pulse Generator Implant Date: 20200728

## 2022-11-14 DIAGNOSIS — F1120 Opioid dependence, uncomplicated: Secondary | ICD-10-CM | POA: Diagnosis not present

## 2022-11-21 DIAGNOSIS — F1120 Opioid dependence, uncomplicated: Secondary | ICD-10-CM | POA: Diagnosis not present

## 2022-11-26 DIAGNOSIS — F1120 Opioid dependence, uncomplicated: Secondary | ICD-10-CM | POA: Diagnosis not present

## 2022-11-28 DIAGNOSIS — F1120 Opioid dependence, uncomplicated: Secondary | ICD-10-CM | POA: Diagnosis not present

## 2022-12-01 NOTE — Progress Notes (Deleted)
    SUBJECTIVE:   CHIEF COMPLAINT / HPI:   Hypertension: Patient is a 66 y.o. male who presents today for HTN follow-up.  Home medications include: *** Patient reports *** compliance.  Patient {rwdoesdoesnot:24881} check blood pressure at home.  Patient {HAS HAS XEN:40768} had a BMP in the past 1 year.  Hypothyroidism Prescribed Synthroid daily  PERTINENT  PMH / PSH: h/o CVA, HLD, chronic hep C, chronic back pain, on methadone  OBJECTIVE:   There were no vitals taken for this visit.  ***  ASSESSMENT/PLAN:   No problem-specific Assessment & Plan notes found for this encounter.     Maury Dus, MD [facility]

## 2022-12-02 ENCOUNTER — Ambulatory Visit: Payer: Medicare HMO | Admitting: Family Medicine

## 2022-12-05 DIAGNOSIS — F1120 Opioid dependence, uncomplicated: Secondary | ICD-10-CM | POA: Diagnosis not present

## 2022-12-12 DIAGNOSIS — F1120 Opioid dependence, uncomplicated: Secondary | ICD-10-CM | POA: Diagnosis not present

## 2022-12-17 ENCOUNTER — Ambulatory Visit (INDEPENDENT_AMBULATORY_CARE_PROVIDER_SITE_OTHER): Payer: Medicare HMO

## 2022-12-17 DIAGNOSIS — I639 Cerebral infarction, unspecified: Secondary | ICD-10-CM | POA: Diagnosis not present

## 2022-12-17 LAB — CUP PACEART REMOTE DEVICE CHECK
Date Time Interrogation Session: 20231225230450
Implantable Pulse Generator Implant Date: 20200728

## 2022-12-18 ENCOUNTER — Ambulatory Visit: Payer: Medicare HMO | Admitting: Family Medicine

## 2022-12-19 DIAGNOSIS — F1120 Opioid dependence, uncomplicated: Secondary | ICD-10-CM | POA: Diagnosis not present

## 2022-12-26 NOTE — Progress Notes (Signed)
Carelink Summary Report / Loop Recorder 

## 2023-01-02 DIAGNOSIS — F1120 Opioid dependence, uncomplicated: Secondary | ICD-10-CM | POA: Diagnosis not present

## 2023-01-13 NOTE — Progress Notes (Signed)
Carelink Summary Report / Loop Recorder

## 2023-01-15 ENCOUNTER — Telehealth: Payer: Self-pay

## 2023-01-15 NOTE — Telephone Encounter (Signed)
LVM for patient to call device back to discuss loop recorder at [facility], also spoke with wife who stated she would have patient call direct number to device clinic given.

## 2023-01-15 NOTE — Telephone Encounter (Signed)
Spoke with patient informed him that he can either loop recorder in or have it taken out, informed patient that having taken it out would require in office visit in [place] patient stated he would think about it and call back direct number to device clinic given.

## 2023-01-16 DIAGNOSIS — F1120 Opioid dependence, uncomplicated: Secondary | ICD-10-CM | POA: Diagnosis not present

## 2023-01-20 ENCOUNTER — Telehealth: Payer: Self-pay

## 2023-01-20 ENCOUNTER — Ambulatory Visit (INDEPENDENT_AMBULATORY_CARE_PROVIDER_SITE_OTHER): Payer: Medicare HMO | Admitting: Student

## 2023-01-20 ENCOUNTER — Encounter: Payer: Self-pay | Admitting: Student

## 2023-01-20 VITALS — BP 124/68 | HR 83 | Temp 98.6°F | Wt 287.4 lb

## 2023-01-20 DIAGNOSIS — R601 Generalized edema: Secondary | ICD-10-CM

## 2023-01-20 DIAGNOSIS — E039 Hypothyroidism, unspecified: Secondary | ICD-10-CM | POA: Diagnosis not present

## 2023-01-20 DIAGNOSIS — R42 Dizziness and giddiness: Secondary | ICD-10-CM | POA: Diagnosis not present

## 2023-01-20 NOTE — Progress Notes (Signed)
    SUBJECTIVE:   CHIEF COMPLAINT / HPI:   Dizziness Last night was sitting on at edge bed, fell asleep, woke up and sat up then felt dizzy. After a few minutes, felt back to normal. Not feeling dizzy today.  Episodic or acute: Episodic, transient vertigo lasting less than 3 minutes. Spontaneous or triggered: Triggered, with sitting up Traumatic/toxic insults: None Hearing loss: None  Lower extremity edema Patient is poor historian, and limited access to medical records. Reports seeing a doctor about his liver (GI most likely). Drozack, hepatologist, will see in February 15-upon extensive chart review, patient had chronic hepatitis C for which she was treated with Mavyret and achieved sustained virologic response and cure-however if continues to have steatosis on ultrasound would be at risk of progression to fibrosis.  Fibrotic liver may play a role. Denies heart history, but had implantable cardiac recorder for several years- appears he was cleared by cardiology. No chest pain, no SOB, no cough.   Also needs refills of all medication.   OBJECTIVE:   BP 124/68   Pulse 83   Wt 287 lb 6.4 oz (130.4 kg)   SpO2 97%   BMI 38.98 kg/m    General: NAD, pleasant HEENT: Normocephalic, atraumatic head. EOM intact and normal conjunctiva BL.  No nystagmus. Cardio: RRR, no MRG. Cap Refill <2s.  +1 bilateral lower extremity pitting edema.  Ascites present.  Mild edema in hands and arms present. Respiratory: CTAB, normal wob on RA GI: Abdomen is distended, fluid wave present, moderate ascites.  Not tender. Skin: Warm and dry  ASSESSMENT/PLAN:   Dizziness Episodic and triggered, consistent with episodic vestibular syndrome of which therapy causes.  No nystagmus on exam. Orthostatics positive in office today, with diastolic drop greater than 10 mmHg with lying down to sitting plus episode of dizziness.  Leading differential is orthostatic hypotension. Differential includes  BPPV vasovagal  reflex, thyroid disease, metabolic disorder, arrhythmia (however patient was closely monitored for arrhythmias and cleared by cardiology). Unfortunately due to timing and patient's limitation of getting onto bed, Dix-Hallpike was not performed-recommend Dix-Hallpike if symptoms return. Lower concern for ACS and aortic dissection, asymptomatic/normal exam. Low concern for PE, normal pulse and SpO2.  Lower concern of infection, no fever, no identifiable source, no associated symptoms. -Continue to monitor - Recommend compression stockings and abdominal binders, getting up slowly, increasing free water intake  Anasarca Differential is broad and includes liver disease (known HepC-cured), cardiac disease, renal disease.  Will obtain CMP to assess renal and liver function + albumin, will defer more specialized testing to his hepatologist appointment in February - BNP/CMP -Consider treatment based on results -Consider echocardiogram pending results (echo in 2020 showing normal ejection fraction, normal left and right function)  Hypothyroidism Refill Synthroid.  Check TSH. -TSH  Follow-up in 2 weeks to discuss worsening edema and follow-up on dizziness.  Leslie Dales, [place]

## 2023-01-20 NOTE — Patient Instructions (Addendum)
It was great to see you! Thank you for allowing me to participate in your care!   I recommend that you always bring your medications to each appointment as this makes it easy to ensure we are on the correct medications and helps [place] not miss when refills are needed.  Our plans for today:  -If you feel dizzy when standing, take time to rest before standing.  Squeeze her legs together to increase pressure. -Do not take Viagra at this time -And drinking plenty of water to increase volume in your arteries and veins - I have refilled your medications -We will follow-up in 2 weeks or sooner  We are checking some labs today, I will call you if they are abnormal will send you a MyChart message or a letter if they are normal.  If you do not hear about your labs in the next 2 weeks please let us know.  Take care and seek immediate care sooner if you develop any concerns. Please remember to show up 15 minutes before your scheduled appointment time!  Leslie Dales, DO [facility]

## 2023-01-20 NOTE — Telephone Encounter (Signed)
Patients wife calls nurse line requesting an apt today.   She reports they no longer live together, however states when she spoke with him today he complained of a headache and fatigue. She reports he also stated he felt "puffy."   She reports he was speaking in full sentences and had no complaints of SOB or chest pains. His speech sounded normal.   Patient scheduled for evaluation for this afternoon.   Advised if he is experiencing any one sides weakness or facial droopiness when she arrives to take him to the ED.

## 2023-01-21 ENCOUNTER — Other Ambulatory Visit: Payer: Self-pay | Admitting: Student

## 2023-01-21 ENCOUNTER — Telehealth: Payer: Self-pay | Admitting: Student

## 2023-01-21 DIAGNOSIS — E039 Hypothyroidism, unspecified: Secondary | ICD-10-CM

## 2023-01-21 DIAGNOSIS — E785 Hyperlipidemia, unspecified: Secondary | ICD-10-CM

## 2023-01-21 DIAGNOSIS — G8929 Other chronic pain: Secondary | ICD-10-CM

## 2023-01-21 LAB — COMPREHENSIVE METABOLIC PANEL
ALT: 8 IU/L (ref 0–44)
AST: 14 IU/L (ref 0–40)
Albumin/Globulin Ratio: 1.5 (ref 1.2–2.2)
Albumin: 4.1 g/dL (ref 3.9–4.9)
Alkaline Phosphatase: 104 IU/L (ref 44–121)
BUN/Creatinine Ratio: 9 — ABNORMAL LOW (ref 10–24)
BUN: 10 mg/dL (ref 8–27)
Bilirubin Total: 0.2 mg/dL (ref 0.0–1.2)
CO2: 26 mmol/L (ref 20–29)
Calcium: 9 mg/dL (ref 8.6–10.2)
Chloride: 105 mmol/L (ref 96–106)
Creatinine, Ser: 1.09 mg/dL (ref 0.76–1.27)
Globulin, Total: 2.8 g/dL (ref 1.5–4.5)
Glucose: 95 mg/dL (ref 70–99)
Potassium: 4.2 mmol/L (ref 3.5–5.2)
Sodium: 144 mmol/L (ref 134–144)
Total Protein: 6.9 g/dL (ref 6.0–8.5)
eGFR: 75 mL/min/{1.73_m2} (ref >59)

## 2023-01-21 LAB — TSH: TSH: 8.53 u[IU]/mL — ABNORMAL HIGH (ref 0.450–4.500)

## 2023-01-21 LAB — BRAIN NATRIURETIC PEPTIDE: BNP: 12.1 pg/mL (ref 0.0–100.0)

## 2023-01-21 MED ORDER — GABAPENTIN 100 MG PO CAPS: 100.0000 mg | ORAL_CAPSULE | Freq: Every day | ORAL | 0 refills | Status: DC

## 2023-01-21 MED ORDER — AMITRIPTYLINE HCL 25 MG PO TABS: 25.0000 mg | ORAL_TABLET | Freq: Every day | ORAL | 0 refills | Status: DC

## 2023-01-21 MED ORDER — ATORVASTATIN CALCIUM 40 MG PO TABS: 40.0000 mg | ORAL_TABLET | Freq: Every day | ORAL | 2 refills | Status: DC

## 2023-01-21 MED ORDER — LEVOTHYROXINE SODIUM 112 MCG PO TABS: 224.0000 ug | ORAL_TABLET | Freq: Every day | ORAL | 0 refills | Status: DC

## 2023-01-21 NOTE — Telephone Encounter (Signed)
Spoke with patient on phone.  Discussed normal CMP results.  Still pending BNP. Of concern TSH is 8.530-after speaking with patient he admits he has not been taking his medication appropriately and has missed many days.  I have sent refill for Synthroid, and recommend recheck in 4 weeks.  Patient did have some orthostatic vital signs in office yesterday, may be related to his hypothyroid state and lack of Synthroid.  Unclear if this is contributing to his anasarca, but is clearly in differential.  At this time patient is awake alert and oriented x 4, reports no orthostatic/dizzy episode since Sunday, feels well other than his swelling.  Lower concern for myxedema coma at this time.  Return and ED precautions discussed.  Patient needs follow-up in the next 2 weeks to reassess swelling.  Treatment was held until renal function and liver function was obtained.  Could consider Lasix at next visit.   Follow-up recommendations Anasarca 1.) If BNP elevated, obtain Echo.  2.) Orthostatics may be related to hypothyroid (HIGH TSH on recheck, and not compliant with synthroid).  3.) Lower concern for myxedema currently, but reassess 4.) Sees hepatologist on Feb 06, 2023. Albumin and total protein normal and LFT normal. Renal function normal.  Swelling is less likely liver or renal, but normal LFTs not completely rule out liver pathology. 5.) Consider treatment with diuretics. 6.)  Recheck TSH in 4 weeks, approximately February 20th.  Please ensure patient has appropriate follow-up.

## 2023-01-21 NOTE — Progress Notes (Signed)
Medication refill

## 2023-01-22 ENCOUNTER — Encounter: Payer: Self-pay | Admitting: Student

## 2023-01-24 DIAGNOSIS — F1120 Opioid dependence, uncomplicated: Secondary | ICD-10-CM | POA: Diagnosis not present

## 2023-01-30 DIAGNOSIS — F1120 Opioid dependence, uncomplicated: Secondary | ICD-10-CM | POA: Diagnosis not present

## 2023-01-31 ENCOUNTER — Encounter: Payer: Self-pay | Admitting: Student

## 2023-01-31 ENCOUNTER — Ambulatory Visit (INDEPENDENT_AMBULATORY_CARE_PROVIDER_SITE_OTHER): Payer: Medicare HMO | Admitting: Student

## 2023-01-31 VITALS — BP 112/76 | HR 79 | Ht 72.0 in | Wt 290.1 lb

## 2023-01-31 DIAGNOSIS — I63422 Cerebral infarction due to embolism of left anterior cerebral artery: Secondary | ICD-10-CM

## 2023-01-31 DIAGNOSIS — R600 Localized edema: Secondary | ICD-10-CM | POA: Diagnosis not present

## 2023-01-31 DIAGNOSIS — I10 Essential (primary) hypertension: Secondary | ICD-10-CM | POA: Diagnosis not present

## 2023-01-31 DIAGNOSIS — Z713 Dietary counseling and surveillance: Secondary | ICD-10-CM | POA: Diagnosis not present

## 2023-01-31 NOTE — Assessment & Plan Note (Signed)
History significant for HTN and chronic hepatitis C (without cirrhosis).  Last visit CMP within normal limits, normal LFTs and albumin.  BNP of 12.1.  TSH was elevated, and patient started on Synthroid (recheck in 4 to 6 weeks).  Patient follows with liver care, appointment next week-although I do not suspect he is having worsening liver function, I will follow-up on their visit.  Patient had echocardiogram in 2020, which showed normal LVEF function and PFO with shunt-due to worsening of edema and unclear etiology, recommend repeat echocardiogram.  Hypothyroid, underlying heart failure, and venous stasis insufficiency are primary differentials.  Patient counseled on symptom control. - Echocardiogram - Compression socks, raise legs, reduce sodium intake

## 2023-01-31 NOTE — Progress Notes (Signed)
    SUBJECTIVE:   CHIEF COMPLAINT / HPI:   Seen Jan 30 for dizziness- resolved.   Bilateral leg edema CMP WNL, BNP WNL at last visit.  Patient endorses no significant improvement or worsening of his bilateral leg edema.  Notable, TSH was elevated and patient had not been taking his Synthroid-may contribute to leg edema.  Patient is now taking Synthroid every day, plans to recheck TSH in 4 to 6 weeks.  No chest pain, no exertional dyspnea, no shortness of breath.  No abdominal pain.  Elevated BMI Patient endorses wanting to lose weight, is seeking weight loss counseling.  He states that he eats a lot of sweets, frequently skipping meals and reduced physical activity.  We discussed lifestyle modifications and made smart goals.  PERTINENT  PMH / PSH: HTN, chronic hepatitis C, HLD, CVA history, PFO, obesity, mood disorder depressed type  OBJECTIVE:   BP 112/76   Pulse 79   Ht 6' (1.829 m)   Wt 290 lb 2 oz (131.6 kg)   SpO2 97%   BMI 39.35 kg/m    General: NAD, pleasant and conversational Cardio: RRR, no murmurs Respiratory: CTAB, normal breathing on room air, speaks in full sentences GI: Soft, not tender, not distended.  Bowel sounds present Extremities: Bilateral +1 pitting edema Skin: Warm and dry  ASSESSMENT/PLAN:   Bilateral leg edema History significant for HTN and chronic hepatitis C (without cirrhosis).  Last visit CMP within normal limits, normal LFTs and albumin.  BNP of 12.1.  TSH was elevated, and patient started on Synthroid (recheck in 4 to 6 weeks).  Patient follows with liver care, appointment next week-although I do not suspect he is having worsening liver function, I will follow-up on their visit.  Patient had echocardiogram in 2020, which showed normal LVEF function and PFO with shunt-due to worsening of edema and unclear etiology, recommend repeat echocardiogram.  Hypothyroid, underlying heart failure, and venous stasis insufficiency are primary differentials.   Patient counseled on symptom control. - Echocardiogram - Compression socks, raise legs, reduce sodium intake  Encounter for weight loss counseling Some component of weight may be related to fluid retention in legs.  Additionally lifestyle modifications as below.  Will continue to engage with lifestyle modifications, as patient meets goals. SMART GOAL: Do not skip meals, at meals increase protein and reduce carbohydrate by 50% (1 g of protein for every 10 cal).  Follow-up in 4 to 6 weeks. Bonus goal: Reduce soda consumption by 50%.  Follow-up in 4 to 6 weeks.  Follow-up recommendations 1.)  Repeat TSH 2.)  Needs A1C!!! (May qualify for ozempic if diabetes) 3.)  Review echocardiogram with patient 4.)  Discuss weight loss goals 5.)  Follow-up on leg swelling 6.)  Rescreen for alcohol and substance use   Leslie Dales, [place]

## 2023-01-31 NOTE — Patient Instructions (Signed)
It was great to see you! Thank you for allowing me to participate in your care!   I recommend that you always bring your medications to each appointment as this makes it easy to ensure we are on the correct medications and helps [place] not miss when refills are needed.  Our plans for today:  - For every 10 calories, eat 1 gram of protein. If a protein bar is 200 calories, it should also have 20g.  - Decrease carbs by half, and replace with protein - Bonus goal: Reduce soda intake by 50% - Use compression socks, and raise feet above chest when swollen. - We will try to obtain echocardiogram, we will call you with the appointment time  Take care and seek immediate care sooner if you develop any concerns. Please remember to show up 15 minutes before your scheduled appointment time!  Leslie Dales, DO [facility]

## 2023-01-31 NOTE — Assessment & Plan Note (Signed)
Some component of weight may be related to fluid retention in legs.  Additionally lifestyle modifications as below.  Will continue to engage with lifestyle modifications, as patient meets goals. SMART GOAL: Do not skip meals, at meals increase protein and reduce carbohydrate by 50% (1 g of protein for every 10 cal).  Follow-up in 4 to 6 weeks. Bonus goal: Reduce soda consumption by 50%.  Follow-up in 4 to 6 weeks.

## 2023-02-06 ENCOUNTER — Other Ambulatory Visit: Payer: Self-pay | Admitting: Nurse Practitioner

## 2023-02-06 DIAGNOSIS — K7400 Hepatic fibrosis, unspecified: Secondary | ICD-10-CM

## 2023-02-06 DIAGNOSIS — K760 Fatty (change of) liver, not elsewhere classified: Secondary | ICD-10-CM | POA: Diagnosis not present

## 2023-02-06 DIAGNOSIS — Z8619 Personal history of other infectious and parasitic diseases: Secondary | ICD-10-CM | POA: Diagnosis not present

## 2023-02-13 DIAGNOSIS — F1120 Opioid dependence, uncomplicated: Secondary | ICD-10-CM | POA: Diagnosis not present

## 2023-02-27 DIAGNOSIS — F1120 Opioid dependence, uncomplicated: Secondary | ICD-10-CM | POA: Diagnosis not present

## 2023-03-06 ENCOUNTER — Other Ambulatory Visit: Payer: Self-pay | Admitting: Student

## 2023-03-06 DIAGNOSIS — E039 Hypothyroidism, unspecified: Secondary | ICD-10-CM

## 2023-03-10 ENCOUNTER — Ambulatory Visit: Payer: Medicare HMO | Admitting: Student

## 2023-03-10 ENCOUNTER — Other Ambulatory Visit: Payer: Medicare HMO

## 2023-03-13 DIAGNOSIS — F1120 Opioid dependence, uncomplicated: Secondary | ICD-10-CM | POA: Diagnosis not present

## 2023-03-18 ENCOUNTER — Ambulatory Visit (HOSPITAL_COMMUNITY)
Admission: RE | Admit: 2023-03-18 | Discharge: 2023-03-18 | Disposition: A | Discharge status: Home / Self Care | Payer: Medicare HMO | Source: Ambulatory Visit | Attending: Family Medicine | Admitting: Family Medicine

## 2023-03-18 DIAGNOSIS — R600 Localized edema: Secondary | ICD-10-CM | POA: Diagnosis not present

## 2023-03-18 DIAGNOSIS — E669 Obesity, unspecified: Secondary | ICD-10-CM | POA: Insufficient documentation

## 2023-03-18 DIAGNOSIS — Z86718 Personal history of other venous thrombosis and embolism: Secondary | ICD-10-CM | POA: Diagnosis not present

## 2023-03-18 DIAGNOSIS — I10 Essential (primary) hypertension: Secondary | ICD-10-CM | POA: Diagnosis not present

## 2023-03-18 DIAGNOSIS — I63422 Cerebral infarction due to embolism of left anterior cerebral artery: Secondary | ICD-10-CM | POA: Diagnosis not present

## 2023-03-18 DIAGNOSIS — I358 Other nonrheumatic aortic valve disorders: Secondary | ICD-10-CM | POA: Insufficient documentation

## 2023-03-18 DIAGNOSIS — Z8673 Personal history of transient ischemic attack (TIA), and cerebral infarction without residual deficits: Secondary | ICD-10-CM | POA: Insufficient documentation

## 2023-03-18 LAB — ECHOCARDIOGRAM COMPLETE
Area-P 1/2: 4.06 cm2
Calc EF: 52.6 %
S' Lateral: 3.4 cm
Single Plane A2C EF: 51 %
Single Plane A4C EF: 53.7 %

## 2023-03-27 DIAGNOSIS — F1120 Opioid dependence, uncomplicated: Secondary | ICD-10-CM | POA: Diagnosis not present

## 2023-04-01 ENCOUNTER — Telehealth: Payer: Self-pay

## 2023-04-01 NOTE — Telephone Encounter (Signed)
Patient calls nurse line requesting results of ECHO.   Will forward to provider who saw patient.

## 2023-04-01 NOTE — Telephone Encounter (Signed)
Discussed echo results with patient, relatively unchanged from previous echo 3 years ago.  Do not believe his heart failure is contributing to his leg swelling, currently believe this is most likely venous insufficiency and possibly hypothyroidism-we are rechecking TSH at next visit.  He endorses an injury to his knee a few days ago, and I recommend he keep his appointment to be seen on Friday.

## 2023-04-03 DIAGNOSIS — F1120 Opioid dependence, uncomplicated: Secondary | ICD-10-CM | POA: Diagnosis not present

## 2023-04-04 ENCOUNTER — Ambulatory Visit: Payer: Medicare HMO | Admitting: Family Medicine

## 2023-04-09 ENCOUNTER — Ambulatory Visit: Payer: Medicare HMO | Admitting: Family Medicine

## 2023-04-10 ENCOUNTER — Other Ambulatory Visit: Payer: Medicare HMO

## 2023-04-10 DIAGNOSIS — F1120 Opioid dependence, uncomplicated: Secondary | ICD-10-CM | POA: Diagnosis not present

## 2023-04-11 ENCOUNTER — Encounter: Payer: Self-pay | Admitting: Family Medicine

## 2023-04-11 ENCOUNTER — Ambulatory Visit (INDEPENDENT_AMBULATORY_CARE_PROVIDER_SITE_OTHER): Payer: Medicare HMO | Admitting: Family Medicine

## 2023-04-11 VITALS — BP 117/79 | HR 70 | Ht 70.5 in | Wt 284.4 lb

## 2023-04-11 DIAGNOSIS — R7303 Prediabetes: Secondary | ICD-10-CM

## 2023-04-11 DIAGNOSIS — Z6841 Body Mass Index (BMI) 40.0 and over, adult: Secondary | ICD-10-CM | POA: Diagnosis not present

## 2023-04-11 DIAGNOSIS — E039 Hypothyroidism, unspecified: Secondary | ICD-10-CM

## 2023-04-11 DIAGNOSIS — R600 Localized edema: Secondary | ICD-10-CM | POA: Diagnosis not present

## 2023-04-11 DIAGNOSIS — E785 Hyperlipidemia, unspecified: Secondary | ICD-10-CM

## 2023-04-11 NOTE — Progress Notes (Unsigned)
    SUBJECTIVE:   CHIEF COMPLAINT / HPI:   Thyroid -taking daily, good compliance. Recheck levels  Swelling -feels generally swollen -echo was mostly unremarkable, grade 1 diastolic dysfunction -has RUQ ultrasound scheduled -no shortness of breath  PERTINENT  PMH / PSH: ***  OBJECTIVE:   BP 117/79   Pulse 70   Ht 5' 10.5" (1.791 m)   Wt 284 lb 6.4 oz (129 kg)   SpO2 99%   BMI 40.23 kg/m   ***  ASSESSMENT/PLAN:   No problem-specific Assessment & Plan notes found for this encounter.     Maury Dus, MD [facility]

## 2023-04-11 NOTE — Patient Instructions (Addendum)
It was great to see you!  Things we discussed today: We are checking some labs, including your thyroid numbers. I will send you a letter with the results or call if needed. Be sure to get your liver ultrasound and follow-up with your liver doctor Wear compression socks as often as possible Follow up in 2 months   Take care, Dr Anner Crete

## 2023-04-12 LAB — LIPID PANEL
Chol/HDL Ratio: 2.8 ratio (ref 0.0–5.0)
Cholesterol, Total: 113 mg/dL (ref 100–199)
HDL: 40 mg/dL (ref >39)
LDL Chol Calc (NIH): 54 mg/dL (ref 0–99)
Triglycerides: 102 mg/dL (ref 0–149)
VLDL Cholesterol Cal: 19 mg/dL (ref 5–40)

## 2023-04-12 LAB — CBC
Hematocrit: 40 % (ref 37.5–51.0)
Hemoglobin: 12.7 g/dL — ABNORMAL LOW (ref 13.0–17.7)
MCH: 28.1 pg (ref 26.6–33.0)
MCHC: 31.8 g/dL (ref 31.5–35.7)
MCV: 89 fL (ref 79–97)
Platelets: 195 10*3/uL (ref 150–450)
RBC: 4.52 x10E6/uL (ref 4.14–5.80)
RDW: 12.8 % (ref 11.6–15.4)
WBC: 6.2 10*3/uL (ref 3.4–10.8)

## 2023-04-12 LAB — HEMOGLOBIN A1C
Est. average glucose Bld gHb Est-mCnc: 128 mg/dL
Hgb A1c MFr Bld: 6.1 % — ABNORMAL HIGH (ref 4.8–5.6)

## 2023-04-12 LAB — TSH: TSH: 1.03 u[IU]/mL (ref 0.450–4.500)

## 2023-04-13 DIAGNOSIS — R7303 Prediabetes: Secondary | ICD-10-CM | POA: Insufficient documentation

## 2023-04-13 NOTE — Assessment & Plan Note (Signed)
Chronic. Workup including CMP, TSH, echo have been unremarkable aside from grade 1 diastolic dysfunction but patient without respiratory symptoms and weight has been stable. Awaiting RUQ ultrasound from hepatologist. Will check CBC today to complete workup but suspect venous insufficiency as etiology. Advised compression socks and elevation.

## 2023-04-13 NOTE — Assessment & Plan Note (Signed)
Improved adherence to Synthroid daily. Recheck TSH today.

## 2023-04-13 NOTE — Assessment & Plan Note (Signed)
-  Check updated lipid panel today

## 2023-04-13 NOTE — Assessment & Plan Note (Signed)
-  Check screening A1c

## 2023-04-16 ENCOUNTER — Encounter: Payer: Self-pay | Admitting: Family Medicine

## 2023-04-24 ENCOUNTER — Telehealth: Payer: Self-pay | Admitting: Family Medicine

## 2023-04-24 DIAGNOSIS — F1120 Opioid dependence, uncomplicated: Secondary | ICD-10-CM | POA: Diagnosis not present

## 2023-04-24 NOTE — Telephone Encounter (Signed)
Contacted Jimmye Norman to schedule their annual wellness visit. Appointment made for 04/28/2023.  Thank you,  [facility] Direct dial  [phone]

## 2023-04-27 NOTE — Progress Notes (Addendum)
I connected with  Ricky Walter on 04/28/2023 by a audio enabled telemedicine application and verified that I am speaking with the correct person using two identifiers.  Patient Location: Home  Provider Location: Home Office  I discussed the limitations of evaluation and management by telemedicine. The patient expressed understanding and agreed to proceed.   Subjective:   Ricky Walter is a 67 y.o. male who presents for an Initial Medicare Annual Wellness Visit.  Review of Systems    Per HPI unless specifically indicated below. Cardiac Risk Factors include: advanced age (>52men, >1 women);male gender, Hypertension, and Hyperlipidemia.        Objective:       04/11/2023   10:17 AM 01/31/2023    2:58 PM 01/20/2023    3:13 PM  Vitals with BMI  Height 5' 10.5" 6\' 0"    Weight 284 lbs 6 oz 290 lbs 2 oz 287 lbs 6 oz  BMI 40.22 39.34   Systolic 117 112 161  Diastolic 79 76 68  Pulse 70 79 83    Today's Vitals   04/28/23 0831  PainSc: 8    There is no height or weight on file to calculate BMI.     01/31/2023    2:56 PM 01/20/2023    3:14 PM 11/20/2021    3:39 PM 09/04/2021    1:44 PM 06/27/2021    2:43 PM 03/26/2021    4:22 PM 01/11/2021    3:53 PM  Advanced Directives  Does Patient Have a Medical Advance Directive? No No Yes No No No No  Type of Advance Directive   Living will      Would patient like information on creating a medical advance directive? No - Patient declined No - Patient declined  No - Patient declined  No - Patient declined No - Patient declined    Current Medications (verified) Outpatient Encounter Medications as of 04/28/2023  Medication Sig   amitriptyline (ELAVIL) 25 MG tablet Take 1 tablet (25 mg total) by mouth at bedtime.   aspirin 81 MG EC tablet Take 1 tablet (81 mg total) by mouth daily.   atorvastatin (LIPITOR) 40 MG tablet Take 1 tablet (40 mg total) by mouth daily.   gabapentin (NEURONTIN) 100 MG capsule Take 1 capsule (100 mg total) by mouth at  bedtime.   methadone (DOLOPHINE) 10 MG tablet Take by mouth.   sildenafil (VIAGRA) 50 MG tablet Take 1 tablet (50 mg total) by mouth daily as needed for erectile dysfunction.   levothyroxine (SYNTHROID) 112 MCG tablet TAKE 2 TABLETS (224 MCG TOTAL) BY MOUTH DAILY.   No facility-administered encounter medications on file as of 04/28/2023.    Allergies (verified) Duloxetine   History: Past Medical History:  Diagnosis Date   Anxiety    Arthritis    Chronic back pain 08/07/2015   Family hx-stroke 07/19/2019   Hyperlipidemia    Hypertension    Thyroid disease    Past Surgical History:  Procedure Laterality Date   ANTERIOR FUSION CERVICAL SPINE     BUBBLE STUDY  07/20/2019   Procedure: BUBBLE STUDY;  Surgeon: Quintella Reichert, MD;  Location: [facility];  Service: Cardiovascular;;   LOOP RECORDER INSERTION N/A 07/20/2019   Procedure: LOOP RECORDER INSERTION;  Surgeon: Hillis Range, MD;  Location: [facility];  Service: Cardiovascular;  Laterality: N/A;   TEE WITHOUT CARDIOVERSION N/A 07/20/2019   Procedure: TRANSESOPHAGEAL ECHOCARDIOGRAM (TEE);  Surgeon: Quintella Reichert, MD;  Location: [facility];  Service: Cardiovascular;  Laterality: N/A;   Family History  Problem Relation Age of Onset   Hyperlipidemia Mother    Hypertension Mother    Thyroid disease Mother    Hyperlipidemia Father    Hypertension Father    Stroke Father    Social History   Socioeconomic History   Marital status: Divorced    Spouse name: Not on file   Number of children: 2   Years of education: Not on file   Highest education level: Not on file  Occupational History   Occupation: Retired  Tobacco Use   Smoking status: Never   Smokeless tobacco: Never  Vaping Use   Vaping Use: Never used  Substance and Sexual Activity   Alcohol use: No   Drug use: No   Sexual activity: Yes    Birth control/protection: None  Other Topics Concern   Not on file  Social History Narrative   Not on file    Social Determinants of Health   Financial Resource Strain: Medium Risk (04/28/2023)   Overall Financial Resource Strain (CARDIA)    Difficulty of Paying Living Expenses: Somewhat hard  Food Insecurity: No Food Insecurity (04/28/2023)   Hunger Vital Sign    Worried About Running Out of Food in the Last Year: Never true    Ran Out of Food in the Last Year: Never true  Transportation Needs: No Transportation Needs (04/28/2023)   PRAPARE - Administrator, Civil Service (Medical): No    Lack of Transportation (Non-Medical): No  Physical Activity: Insufficiently Active (04/28/2023)   Exercise Vital Sign    Days of Exercise per Week: 3 days    Minutes of Exercise per Session: 20 min  Stress: No Stress Concern Present (04/28/2023)   [employer] of Occupational Health - Occupational Stress Questionnaire    Feeling of Stress : Not at all  Social Connections: Moderately Integrated (04/28/2023)   Social Connection and Isolation Panel [NHANES]    Frequency of Communication with Friends and Family: More than three times a week    Frequency of Social Gatherings with Friends and Family: More than three times a week    Attends Religious Services: More than 4 times per year    Active Member of [employer] or Organizations: Yes    Attends Banker Meetings: Never    Marital Status: Divorced    Tobacco Counseling Counseling given: No   Clinical Intake:  Pre-visit preparation completed: No  Pain : 0-10 Pain Score: 8  Pain Type: Chronic pain Pain Location: Back Pain Orientation: Lower Pain Descriptors / Indicators: Aching Pain Onset: More than a month ago Pain Frequency: Constant     Nutritional Status: BMI > 30  Obese Nutritional Risks: None Diabetes: No  How often do you need to have someone help you when you read instructions, pamphlets, or other written materials from your doctor or pharmacy?: 1 - Never  Diabetic?No   Interpreter Needed?: No  Information  entered by :: Laurel Dimmer, CMA   Activities of Daily Living    04/28/2023    8:31 AM  In your present state of health, do you have any difficulty performing the following activities:  Hearing? 0  Vision? 0  Difficulty concentrating or making decisions? 0  Walking or climbing stairs? 0  Dressing or bathing? 0  Doing errands, shopping? 0    Patient Care Team: Maury Dus, MD as PCP - General (Family Medicine)  Indicate any recent Medical Services you may have received  from other than [facility] providers in the past year (date may be approximate).     Assessment:   This is a routine wellness examination for Ricky Walter.  Hearing/Vision screen Denies any hearing issues. Denies any change to her vision.Overdue Annual Eye Exam.   Dietary issues and exercise activities discussed: Current Exercise Habits: Structured exercise class, Time (Minutes): 20, Frequency (Times/Week): 3, Weekly Exercise (Minutes/Week): 60, Intensity: Mild, Exercise limited by: None identified   Goals Addressed   None    Depression Screen    04/28/2023    8:31 AM 01/31/2023    2:57 PM 01/20/2023    3:19 PM 08/30/2022    9:40 AM 11/20/2021    3:40 PM 09/04/2021    1:43 PM 07/06/2021    9:09 AM  PHQ 2/9 Scores  PHQ - 2 Score 0 0 0 0 1 0 0  PHQ- 9 Score  0 2  2 0 0    Fall Risk    04/28/2023    8:31 AM 01/31/2023    2:57 PM 03/26/2021    4:22 PM 06/20/2020    2:28 PM 09/25/2017    2:57 PM  Fall Risk   Falls in the past year? 0 0 0 0 Yes  Number falls in past yr: 0 0  0 2 or more  Injury with Fall? 0 0  0 No  Risk for fall due to : No Fall Risks      Follow up Falls evaluation completed        FALL RISK PREVENTION PERTAINING TO THE HOME:  Any stairs in or around the home? Yes  If so, are there any without handrails? Yes   Home free of loose throw rugs in walkways, pet beds, electrical cords, etc? Yes  Adequate lighting in your home to reduce risk of falls? Yes   ASSISTIVE DEVICES UTILIZED TO PREVENT  FALLS:  Life alert? No  Use of a cane, walker or w/c? Yes  Grab bars in the bathroom? No  Shower chair or bench in shower? No  Elevated toilet seat or a handicapped toilet? No   TIMED UP AND GO:  Was the test performed?Unable to perform, virtual appointment   Cognitive Function:        04/28/2023    8:33 AM  6CIT Screen  What Year? 0 points  What month? 0 points  What time? 0 points  Count back from 20 0 points  Months in reverse 0 points  Repeat phrase 0 points  Total Score 0 points    Immunizations Immunization History  Administered Date(s) Administered   Hepatitis B, ADULT 08/28/2017   Influenza,inj,Quad PF,6+ Mos 08/28/2017, 09/04/2021   PFIZER Comirnaty(Gray Top)Covid-19 Tri-Sucrose Vaccine 06/27/2021   PFIZER(Purple Top)SARS-COV-2 Vaccination 09/22/2020, 11/01/2020    TDAP status: Due, Education has been provided regarding the importance of this vaccine. Advised may receive this vaccine at local pharmacy or Health Dept. Aware to provide a copy of the vaccination record if obtained from local pharmacy or Health Dept. Verbalized acceptance and understanding.  Flu Vaccine status: Up to date  Pneumococcal vaccine status: Due, Education has been provided regarding the importance of this vaccine. Advised may receive this vaccine at local pharmacy or Health Dept. Aware to provide a copy of the vaccination record if obtained from local pharmacy or Health Dept. Verbalized acceptance and understanding.  Covid-19 vaccine status: Information provided on how to obtain vaccines.   Qualifies for Shingles Vaccine? Yes   Zostavax completed No   Shingrix Completed?:  No.    Education has been provided regarding the importance of this vaccine. Patient has been advised to call insurance company to determine out of pocket expense if they have not yet received this vaccine. Advised may also receive vaccine at local pharmacy or Health Dept. Verbalized acceptance and  understanding.  Screening Tests Health Maintenance  Topic Date Due   DTaP/Tdap/Td (1 - Tdap) Never done   COLONOSCOPY (Pts 45-23yrs Insurance coverage will need to be confirmed)  Never done   Zoster Vaccines- Shingrix (1 of 2) Never done   Pneumonia Vaccine 22+ Years old (1 of 1 - PCV) Never done   COVID-19 Vaccine (4 - 2023-24 season) 08/23/2022   INFLUENZA VACCINE  07/24/2023   Medicare Annual Wellness (AWV)  04/27/2024   Hepatitis C Screening  Completed   HPV VACCINES  Aged Out    Health Maintenance  Health Maintenance Due  Topic Date Due   DTaP/Tdap/Td (1 - Tdap) Never done   COLONOSCOPY (Pts 45-74yrs Insurance coverage will need to be confirmed)  Never done   Zoster Vaccines- Shingrix (1 of 2) Never done   Pneumonia Vaccine 62+ Years old (1 of 1 - PCV) Never done   COVID-19 Vaccine (4 - 2023-24 season) 08/23/2022    Colorectal cancer screening: Referral to GI placed 04/28/2023. Pt aware the office will call re: appt.  Lung Cancer Screening: (Low Dose CT Chest recommended if Age 44-80 years, 30 pack-year currently smoking OR have quit w/in 15years.) does not qualify.   Lung Cancer Screening Referral: not applicable   Additional Screening:  Hepatitis C Screening: does qualify; Completed 09/21/2020  Vision Screening: Recommended annual ophthalmology exams for early detection of glaucoma and other disorders of the eye. Is the patient up to date with their annual eye exam?  No Who is the provider or what is the name of the office in which the patient attends annual eye exams? Overdue  If pt is not established with a provider, would they like to be referred to a provider to establish care? No .   Dental Screening: Recommended annual dental exams for proper oral hygiene  Community Resource Referral / Chronic Care Management: CRR required this visit?  No   CCM required this visit?  No      Plan:     I have personally reviewed and noted the following in the patient's  chart:   Medical and social history Use of alcohol, tobacco or illicit drugs  Current medications and supplements including opioid prescriptions. Patient is not currently taking opioid prescriptions. Functional ability and status Nutritional status Physical activity Advanced directives List of other physicians Hospitalizations, surgeries, and ER visits in previous 12 months Vitals Screenings to include cognitive, depression, and falls Referrals and appointments  In addition, I have reviewed and discussed with patient certain preventive protocols, quality metrics, and best practice recommendations. A written personalized care plan for preventive services as well as general preventive health recommendations were provided to patient.   Ricky Walter , Thank you for taking time to come for your Medicare Wellness Visit. I appreciate your ongoing commitment to your health goals. Please review the following plan we discussed and let me know if I can assist you in the future.   These are the goals we discussed:  Goals   None     This is a list of the screening recommended for you and due dates:  Health Maintenance  Topic Date Due   DTaP/Tdap/Td vaccine (1 - Tdap) Never  done   Colon Cancer Screening  Never done   Zoster (Shingles) Vaccine (1 of 2) Never done   Pneumonia Vaccine (1 of 1 - PCV) Never done   COVID-19 Vaccine (4 - 2023-24 season) 08/23/2022   Flu Shot  07/24/2023   Medicare Annual Wellness Visit  04/27/2024   Hepatitis C Screening: USPSTF Recommendation to screen - Ages 18-79 yo.  Completed   HPV Vaccine  Aged [address] [place]   04/28/2023  Nurse Notes: Approximately 30 minute Non-Face -To-Face Medicare Wellness Visit        I have reviewed this visit and agree with the documentation.  Marshall L Chambliss

## 2023-04-27 NOTE — Patient Instructions (Signed)
Health Maintenance, Male Adopting a healthy lifestyle and getting preventive care are important in promoting health and wellness. Ask your health care provider about: The right schedule for you to have regular tests and exams. Things you can do on your own to prevent diseases and keep yourself healthy. What should I know about diet, weight, and exercise? Eat a healthy diet  Eat a diet that includes plenty of vegetables, fruits, low-fat dairy products, and lean protein. Do not eat a lot of foods that are high in solid fats, added sugars, or sodium. Maintain a healthy weight Body mass index (BMI) is a measurement that can be used to identify possible weight problems. It estimates body fat based on height and weight. Your health care provider can help determine your BMI and help you achieve or maintain a healthy weight. Get regular exercise Get regular exercise. This is one of the most important things you can do for your health. Most adults should: Exercise for at least 150 minutes each week. The exercise should increase your heart rate and make you sweat (moderate-intensity exercise). Do strengthening exercises at least twice a week. This is in addition to the moderate-intensity exercise. Spend less time sitting. Even light physical activity can be beneficial. Watch cholesterol and blood lipids Have your blood tested for lipids and cholesterol at 67 years of age, then have this test every 5 years. You may need to have your cholesterol levels checked more often if: Your lipid or cholesterol levels are high. You are older than 67 years of age. You are at high risk for heart disease. What should I know about cancer screening? Many types of cancers can be detected early and may often be prevented. Depending on your health history and family history, you may need to have cancer screening at various ages. This may include screening for: Colorectal cancer. Prostate cancer. Skin cancer. Lung  cancer. What should I know about heart disease, diabetes, and high blood pressure? Blood pressure and heart disease High blood pressure causes heart disease and increases the risk of stroke. This is more likely to develop in people who have high blood pressure readings or are overweight. Talk with your health care provider about your target blood pressure readings. Have your blood pressure checked: Every 3-5 years if you are 18-39 years of age. Every year if you are 40 years old or older. If you are between the ages of 65 and 75 and are a current or former smoker, ask your health care provider if you should have a one-time screening for abdominal aortic aneurysm (AAA). Diabetes Have regular diabetes screenings. This checks your fasting blood sugar level. Have the screening done: Once every three years after age 45 if you are at a normal weight and have a low risk for diabetes. More often and at a younger age if you are overweight or have a high risk for diabetes. What should I know about preventing infection? Hepatitis B If you have a higher risk for hepatitis B, you should be screened for this virus. Talk with your health care provider to find out if you are at risk for hepatitis B infection. Hepatitis C Blood testing is recommended for: Everyone born from 1945 through 1965. Anyone with known risk factors for hepatitis C. Sexually transmitted infections (STIs) You should be screened each year for STIs, including gonorrhea and chlamydia, if: You are sexually active and are younger than 67 years of age. You are older than 67 years of age and your   health care provider tells you that you are at risk for this type of infection. Your sexual activity has changed since you were last screened, and you are at increased risk for chlamydia or gonorrhea. Ask your health care provider if you are at risk. Ask your health care provider about whether you are at high risk for HIV. Your health care provider  may recommend a prescription medicine to help prevent HIV infection. If you choose to take medicine to prevent HIV, you should first get tested for HIV. You should then be tested every 3 months for as long as you are taking the medicine. Follow these instructions at home: Alcohol use Do not drink alcohol if your health care provider tells you not to drink. If you drink alcohol: Limit how much you have to 0-2 drinks a day. Know how much alcohol is in your drink. In the [place] one drink equals one 12 oz bottle of beer (355 mL), one 5 oz glass of wine (148 mL), or one 1 oz glass of hard liquor (44 mL). Lifestyle Do not use any products that contain nicotine or tobacco. These products include cigarettes, chewing tobacco, and vaping devices, such as e-cigarettes. If you need help quitting, ask your health care provider. Do not use street drugs. Do not share needles. Ask your health care provider for help if you need support or information about quitting drugs. General instructions Schedule regular health, dental, and eye exams. Stay current with your vaccines. Tell your health care provider if: You often feel depressed. You have ever been abused or do not feel safe at home. Summary Adopting a healthy lifestyle and getting preventive care are important in promoting health and wellness. Follow your health care provider's instructions about healthy diet, exercising, and getting tested or screened for diseases. Follow your health care provider's instructions on monitoring your cholesterol and blood pressure. This information is not intended to replace advice given to you by your health care provider. Make sure you discuss any questions you have with your health care provider. Document Revised: 04/30/2021 Document Reviewed: 04/30/2021 Elsevier Patient Education  2023 Elsevier Inc.  

## 2023-04-28 ENCOUNTER — Ambulatory Visit (INDEPENDENT_AMBULATORY_CARE_PROVIDER_SITE_OTHER): Payer: Medicare HMO

## 2023-04-28 DIAGNOSIS — Z0000 Encounter for general adult medical examination without abnormal findings: Secondary | ICD-10-CM

## 2023-04-28 DIAGNOSIS — Z1211 Encounter for screening for malignant neoplasm of colon: Secondary | ICD-10-CM

## 2023-05-04 DIAGNOSIS — F1120 Opioid dependence, uncomplicated: Secondary | ICD-10-CM | POA: Diagnosis not present

## 2023-05-06 ENCOUNTER — Other Ambulatory Visit: Payer: Self-pay | Admitting: Family Medicine

## 2023-05-06 DIAGNOSIS — E039 Hypothyroidism, unspecified: Secondary | ICD-10-CM

## 2023-05-15 ENCOUNTER — Other Ambulatory Visit: Payer: Medicare HMO

## 2023-05-18 DIAGNOSIS — F1120 Opioid dependence, uncomplicated: Secondary | ICD-10-CM | POA: Diagnosis not present

## 2023-05-27 ENCOUNTER — Other Ambulatory Visit: Payer: Medicare HMO

## 2023-06-01 DIAGNOSIS — F1120 Opioid dependence, uncomplicated: Secondary | ICD-10-CM | POA: Diagnosis not present

## 2023-06-05 DIAGNOSIS — F1120 Opioid dependence, uncomplicated: Secondary | ICD-10-CM | POA: Diagnosis not present

## 2023-06-15 DIAGNOSIS — F1120 Opioid dependence, uncomplicated: Secondary | ICD-10-CM | POA: Diagnosis not present

## 2023-06-25 DIAGNOSIS — F1120 Opioid dependence, uncomplicated: Secondary | ICD-10-CM | POA: Diagnosis not present

## 2023-06-27 ENCOUNTER — Ambulatory Visit (INDEPENDENT_AMBULATORY_CARE_PROVIDER_SITE_OTHER): Payer: Medicare HMO | Admitting: Family Medicine

## 2023-06-27 ENCOUNTER — Other Ambulatory Visit: Payer: Self-pay

## 2023-06-27 VITALS — BP 115/75 | HR 76 | Ht 70.0 in | Wt 289.0 lb

## 2023-06-27 DIAGNOSIS — M25561 Pain in right knee: Secondary | ICD-10-CM

## 2023-06-27 MED ORDER — DICLOFENAC SODIUM 1 % EX GEL
4.0000 g | Freq: Four times a day (QID) | CUTANEOUS | 1 refills | Status: DC | PRN
Start: 2023-06-27 — End: 2023-08-15

## 2023-06-27 NOTE — Progress Notes (Unsigned)
    SUBJECTIVE:   CHIEF COMPLAINT / HPI:   JM is a 67yo M w/ hx of obesity, stroke, HLD that p/w R knee pain. - 2 months ago, Felt a pop in his R knee when trying to sit down on his couch. Pain has been progressivley worsening since then.  - Feels like the pain is "inside" the knee - Pain is now interfering with his sleep, having trouble falling asleep due to the pain.  - Tried putting a heating pad, but it didn't help. - Tylenol didn't help, no longer taking it.  - Took one of his mom's percocet 10 and it helped.  - Denies night sweats - Requesting something for pain relief  OBJECTIVE:   BP 115/75   Pulse 76   Ht 5\' 10"  (1.778 m)   Wt 289 lb (131.1 kg)   SpO2 95%   BMI 41.47 kg/m   General: Alert, pleasant man. Walks using a walker. NAD. HEENT: NCAT. MMM. CV: RRR, no murmurs. Cap refill <2. Resp: CTAB, no wheezing or crackles. Normal WOB on RA.  Abm: Soft, nontender, nondistended. BS present. Skin: Warm, well perfused  Ext: BL knees symmetric. R knee nonerythematous, no effusion. R knee no pain to palpation while sitting or standing. No knee laxity. However the movement of standing and bearing weight and knee flexion elicits pain. Difficult exam due to body habitus.  ASSESSMENT/PLAN:   Right knee pain Acute R knee pain starting 2 months ago after feeling a "pop" while sitting down. Differential includes MSK injury, OA, septic arthritis, and malignancy. MSK injury such as meniscal tear is considered given identifiable preceding trauma and pain with weight bearing. OA is considered given pt obesity. Septic arthritis is less likely given lack of fever, nonerythematous, not hot feeling, and nontender to palpation on exam. Malignancy is considered given that pain awakes him from sleep and pain is described as "inside" the knee, however denies B symptoms such as night sweats and unintentional weight loss. - Referral to Sports Medicine. Pt would benefit from potential Knee US exam  and/or steroid injection.    Lincoln Brigham, MD [facility]

## 2023-06-27 NOTE — Patient Instructions (Signed)
Good to see you today - Thank you for coming in  Things we discussed today:  1) For your knee pain - Apply Voltaren gel four times a day to the knee - Take tylenol as needed for the pain - You can alternate heat and ice to improve the knee pain - I sent a referral for you to see Sport Medicine. They are specialist in knee injuries and can provide further guidance on how best to heal your knee.

## 2023-06-29 DIAGNOSIS — M25561 Pain in right knee: Secondary | ICD-10-CM | POA: Insufficient documentation

## 2023-06-29 DIAGNOSIS — F1120 Opioid dependence, uncomplicated: Secondary | ICD-10-CM | POA: Diagnosis not present

## 2023-06-29 NOTE — Assessment & Plan Note (Signed)
Acute R knee pain starting 2 months ago after feeling a "pop" while sitting down. Differential includes MSK injury, OA, septic arthritis, and malignancy. MSK injury such as meniscal tear is considered given identifiable preceding trauma and pain with weight bearing. OA is considered given pt obesity. Septic arthritis is less likely given lack of fever, nonerythematous, not hot feeling, and nontender to palpation on exam. Malignancy is considered given that pain awakes him from sleep and pain is described as "inside" the knee, however denies B symptoms such as night sweats and unintentional weight loss. - Referral to Sports Medicine. Pt would benefit from potential Knee US exam and/or steroid injection.

## 2023-07-13 DIAGNOSIS — F1120 Opioid dependence, uncomplicated: Secondary | ICD-10-CM | POA: Diagnosis not present

## 2023-07-14 ENCOUNTER — Other Ambulatory Visit: Payer: Self-pay

## 2023-07-14 DIAGNOSIS — E039 Hypothyroidism, unspecified: Secondary | ICD-10-CM

## 2023-07-15 MED ORDER — LEVOTHYROXINE SODIUM 112 MCG PO TABS
224.0000 ug | ORAL_TABLET | Freq: Every day | ORAL | 0 refills | Status: DC
Start: 2023-07-15 — End: 2023-08-15

## 2023-07-16 ENCOUNTER — Ambulatory Visit: Payer: Medicare HMO | Admitting: Sports Medicine

## 2023-07-27 DIAGNOSIS — F1120 Opioid dependence, uncomplicated: Secondary | ICD-10-CM | POA: Diagnosis not present

## 2023-07-28 ENCOUNTER — Ambulatory Visit: Payer: Medicare HMO | Admitting: Student

## 2023-07-28 NOTE — Progress Notes (Deleted)
  SUBJECTIVE:   CHIEF COMPLAINT / HPI:    Back Pain He reports back pain for quite some time, chronic back pain.  The pain is located in the ***.   It is described as ***, is *** in intensity, occurring.  Symptoms are aggravated by *** He has tried *** for treatment with *** relief    Of note, last visit was on Methadone through pain management and received Amitriptyline and Gabapentin from [place]. He had a referral to spinal surgery to assess him.   Last MRI 01/2022 1. Dominant change since 2017 is increase in epidural fat which completely effaces the thecal sac at L5 and below. 2. Generalized mild progression of lumbar spine degeneration. No compressive herniation or spurring.  Thyroid Check Up: --Last TSH 1.030 --Synthroid 224 mcg daily  -- Denies   PERTINENT  PMH / PSH:   H/o ACDF at C3-4, C4-5, C5-6  Chronic Hep C-treated  CVA   Patient Care Team: Vonna Drafts, MD as PCP - General (Family Medicine) OBJECTIVE:  There were no vitals taken for this visit. Physical Exam   ASSESSMENT/PLAN:  There are no diagnoses linked to this encounter. No follow-ups on file. Alfredo Martinez, MD 07/28/2023, 9:52 AM PGY-3, [facility] {    This will disappear when note is signed, click to select method of visit    :1}

## 2023-07-31 DIAGNOSIS — F1120 Opioid dependence, uncomplicated: Secondary | ICD-10-CM | POA: Diagnosis not present

## 2023-08-04 ENCOUNTER — Ambulatory Visit: Payer: Medicare HMO | Admitting: Student

## 2023-08-05 ENCOUNTER — Ambulatory Visit: Payer: Medicare HMO | Admitting: Sports Medicine

## 2023-08-10 DIAGNOSIS — F1120 Opioid dependence, uncomplicated: Secondary | ICD-10-CM | POA: Diagnosis not present

## 2023-08-14 DIAGNOSIS — F1120 Opioid dependence, uncomplicated: Secondary | ICD-10-CM | POA: Diagnosis not present

## 2023-08-14 NOTE — Progress Notes (Signed)
SUBJECTIVE:   CHIEF COMPLAINT / HPI:    Chronic low back pain -Denies shooting pain down legs. Denies saddle anesthesia, bowel/bladder incontinence, fevers -Has tried physical therapy without benefit -Referral placed last month to sports med for back and knee pain, he has not heard from them yet  MR lumbar spine and 2023 showed lumbar spine degenerative disease with no compressive herniation or spurring, as well as epidural fat which completely effaces the thecal sac at L5 and below  DG lumbar spine in 03/2021 showed disc space narrowing at L3-L4, L4-L5, L5-S1  Doing his best for weight loss - adjusting diet and exercise regimen  Takes methadone per pain mgmt  Does not wish to take additional meds or participate in physical therapy   PERTINENT  PMH / PSH:   Past Medical History:  Diagnosis Date   Anxiety    Arthritis    Chronic back pain 08/07/2015   Family hx-stroke 07/19/2019   Hyperlipidemia    Hypertension    Thyroid disease     OBJECTIVE:  BP 135/81   Pulse 92   Ht 5\' 11"  (1.803 m)   Wt 294 lb 6 oz (133.5 kg)   SpO2 97%   BMI 41.06 kg/m   General: NAD, pleasant, able to participate in exam Cardiac: RRR, no murmurs auscultated Respiratory: CTAB, normal WOB Abdomen: soft, non-tender, non-distended, normoactive bowel sounds Extremities: warm and well perfused, nonpitting edema bilaterally (chronic) MSK: Back: Nontender to palpation along spinous processes. Mild tenderness along lumbar paraspinal muscles. Unable to fully assess ROM due to chronic mobility issues. Negative straight leg test bilaterally.  ASSESSMENT/PLAN:   1. Chronic low back pain without sciatica, unspecified back pain laterality No red flag symptoms.  Refilled medications as below.  Encouraged follow-up with sports medicine, provided number for their office.  Patient does not wish to try additional medications or physical therapy at this time.  He is also on methadone which makes me hesitant to  start anything new.   - amitriptyline (ELAVIL) 25 MG tablet; Take 1 tablet (25 mg total) by mouth at bedtime.  Dispense: 90 tablet; Refill: 0 - gabapentin (NEURONTIN) 100 MG capsule; Take 1 capsule (100 mg total) by mouth at bedtime.  Dispense: 30 capsule; Refill: 0  2. Screening for colon cancer Due for colonoscopy - Ambulatory referral to Gastroenterology  3. Dyslipidemia, goal LDL below 70 Refill - atorvastatin (LIPITOR) 40 MG tablet; Take 1 tablet (40 mg total) by mouth daily.  Dispense: 90 tablet; Refill: 2  4. Acute pain of right knee Refill - diclofenac Sodium (VOLTAREN ARTHRITIS PAIN) 1 % GEL; Apply 4 g topically 4 (four) times daily as needed.  Dispense: 100 each; Refill: 1  5. Hypothyroidism, unspecified type Refill - levothyroxine (SYNTHROID) 112 MCG tablet; Take 2 tablets (224 mcg total) by mouth daily.  Dispense: 60 tablet; Refill: 0  6. Erectile dysfunction, unspecified erectile dysfunction type Refill - sildenafil (VIAGRA) 50 MG tablet; Take 1 tablet (50 mg total) by mouth daily as needed for erectile dysfunction.  Dispense: 20 tablet; Refill: 0   Meds ordered this encounter  Medications   amitriptyline (ELAVIL) 25 MG tablet    Sig: Take 1 tablet (25 mg total) by mouth at bedtime.    Dispense:  90 tablet    Refill:  0   atorvastatin (LIPITOR) 40 MG tablet    Sig: Take 1 tablet (40 mg total) by mouth daily.    Dispense:  90 tablet    Refill:  2  diclofenac Sodium (VOLTAREN ARTHRITIS PAIN) 1 % GEL    Sig: Apply 4 g topically 4 (four) times daily as needed.    Dispense:  100 each    Refill:  1   gabapentin (NEURONTIN) 100 MG capsule    Sig: Take 1 capsule (100 mg total) by mouth at bedtime.    Dispense:  30 capsule    Refill:  0   levothyroxine (SYNTHROID) 112 MCG tablet    Sig: Take 2 tablets (224 mcg total) by mouth daily.    Dispense:  60 tablet    Refill:  0   sildenafil (VIAGRA) 50 MG tablet    Sig: Take 1 tablet (50 mg total) by mouth daily as  needed for erectile dysfunction.    Dispense:  20 tablet    Refill:  0   Return in about 3 months (around 11/15/2023).  Vonna Drafts, MD [facility]

## 2023-08-15 ENCOUNTER — Ambulatory Visit (INDEPENDENT_AMBULATORY_CARE_PROVIDER_SITE_OTHER): Payer: Medicare HMO | Admitting: Family Medicine

## 2023-08-15 ENCOUNTER — Encounter: Payer: Self-pay | Admitting: Family Medicine

## 2023-08-15 VITALS — BP 135/81 | HR 92 | Ht 71.0 in | Wt 294.4 lb

## 2023-08-15 DIAGNOSIS — Z1211 Encounter for screening for malignant neoplasm of colon: Secondary | ICD-10-CM

## 2023-08-15 DIAGNOSIS — E785 Hyperlipidemia, unspecified: Secondary | ICD-10-CM | POA: Diagnosis not present

## 2023-08-15 DIAGNOSIS — E039 Hypothyroidism, unspecified: Secondary | ICD-10-CM

## 2023-08-15 DIAGNOSIS — M545 Low back pain, unspecified: Secondary | ICD-10-CM | POA: Diagnosis not present

## 2023-08-15 DIAGNOSIS — N529 Male erectile dysfunction, unspecified: Secondary | ICD-10-CM

## 2023-08-15 DIAGNOSIS — G8929 Other chronic pain: Secondary | ICD-10-CM

## 2023-08-15 DIAGNOSIS — M25561 Pain in right knee: Secondary | ICD-10-CM | POA: Diagnosis not present

## 2023-08-15 MED ORDER — ATORVASTATIN CALCIUM 40 MG PO TABS
40.0000 mg | ORAL_TABLET | Freq: Every day | ORAL | 2 refills | Status: DC
Start: 2023-08-15 — End: 2023-10-07

## 2023-08-15 MED ORDER — LEVOTHYROXINE SODIUM 112 MCG PO TABS
224.0000 ug | ORAL_TABLET | Freq: Every day | ORAL | 0 refills | Status: DC
Start: 2023-08-15 — End: 2023-10-07

## 2023-08-15 MED ORDER — DICLOFENAC SODIUM 1 % EX GEL
4.0000 g | Freq: Four times a day (QID) | CUTANEOUS | 1 refills | Status: AC | PRN
Start: 2023-08-15 — End: ?

## 2023-08-15 MED ORDER — SILDENAFIL CITRATE 50 MG PO TABS
50.0000 mg | ORAL_TABLET | Freq: Every day | ORAL | 0 refills | Status: DC | PRN
Start: 2023-08-15 — End: 2024-01-21

## 2023-08-15 MED ORDER — GABAPENTIN 100 MG PO CAPS
100.0000 mg | ORAL_CAPSULE | Freq: Every day | ORAL | 0 refills | Status: DC
Start: 2023-08-15 — End: 2023-10-24

## 2023-08-15 MED ORDER — AMITRIPTYLINE HCL 25 MG PO TABS
25.0000 mg | ORAL_TABLET | Freq: Every day | ORAL | 0 refills | Status: AC
Start: 2023-08-15 — End: ?

## 2023-08-15 NOTE — Patient Instructions (Addendum)
[facility] [address] [place],  [place]  [address] Main: [phone]  I have placed a referral to GI to get a colonoscopy  I have sent in refills of your medications

## 2023-08-24 DIAGNOSIS — F1120 Opioid dependence, uncomplicated: Secondary | ICD-10-CM | POA: Diagnosis not present

## 2023-08-28 DIAGNOSIS — F1120 Opioid dependence, uncomplicated: Secondary | ICD-10-CM | POA: Diagnosis not present

## 2023-09-03 ENCOUNTER — Other Ambulatory Visit: Payer: Self-pay

## 2023-09-03 ENCOUNTER — Encounter: Payer: Self-pay | Admitting: Family Medicine

## 2023-09-03 ENCOUNTER — Ambulatory Visit (INDEPENDENT_AMBULATORY_CARE_PROVIDER_SITE_OTHER): Payer: Medicare HMO | Admitting: Family Medicine

## 2023-09-03 VITALS — BP 132/85 | Ht 71.0 in | Wt 285.0 lb

## 2023-09-03 DIAGNOSIS — M25561 Pain in right knee: Secondary | ICD-10-CM

## 2023-09-03 DIAGNOSIS — M1711 Unilateral primary osteoarthritis, right knee: Secondary | ICD-10-CM | POA: Diagnosis not present

## 2023-09-03 MED ORDER — MELOXICAM 15 MG PO TABS: ORAL_TABLET | ORAL | 0 refills | Status: DC

## 2023-09-03 MED ORDER — MELOXICAM 7.5 MG PO TABS
15.0000 mg | ORAL_TABLET | Freq: Once | ORAL | Status: DC
Start: 2023-09-03 — End: 2023-09-03

## 2023-09-03 NOTE — Assessment & Plan Note (Signed)
-   History and exam findings consistent with osteoarthritis - Limited ultrasound consistent with medial compartment advanced osteoarthritis - He has been using Voltaren gel with some mild improvement. - We discussed the use of nightly ice application and starting daily meloxicam for anti-inflammatory. - We also discussed weight loss strategies including cessation of soda intake. -We will follow-up in 4 weeks.  If there is no improvement at that point we can consider steroid injection.

## 2023-09-03 NOTE — Progress Notes (Signed)
Ricky Walter - 67 y.o. male MRN [id number]  Date of birth: [birth year]  PCP: Ricky Drafts, MD  Subjective:  No chief complaint on file. Acute right knee pain  HPI: Past Medical, Surgical, Social, and Family History Reviewed & Updated per EMR.   Patient is a 67 y.o. male here for severity: Moderate, timing: intermittent, unchanged, localized, pain located in the medial anterior right knee that started 2 months ago when he was sitting down on a couch and felt a pop.  There was no immediate ecchymosis but some trace edema that resolved on its own.. It is exacerbated by walking and alleviated by rest. The patient has tried Voltaren gel which has helped mildly.   Past Medical History:  Diagnosis Date   Anxiety    Arthritis    Chronic back pain 08/07/2015   Family hx-stroke 07/19/2019   Hyperlipidemia    Hypertension    Thyroid disease     Current Outpatient Medications on File Prior to Visit  Medication Sig Dispense Refill   amitriptyline (ELAVIL) 25 MG tablet Take 1 tablet (25 mg total) by mouth at bedtime. 90 tablet 0   aspirin 81 MG EC tablet Take 1 tablet (81 mg total) by mouth daily. 30 tablet    atorvastatin (LIPITOR) 40 MG tablet Take 1 tablet (40 mg total) by mouth daily. 90 tablet 2   diclofenac Sodium (VOLTAREN ARTHRITIS PAIN) 1 % GEL Apply 4 g topically 4 (four) times daily as needed. 100 each 1   gabapentin (NEURONTIN) 100 MG capsule Take 1 capsule (100 mg total) by mouth at bedtime. 30 capsule 0   levothyroxine (SYNTHROID) 112 MCG tablet Take 2 tablets (224 mcg total) by mouth daily. 60 tablet 0   methadone (DOLOPHINE) 10 MG tablet Take by mouth.     sildenafil (VIAGRA) 50 MG tablet Take 1 tablet (50 mg total) by mouth daily as needed for erectile dysfunction. 20 tablet 0   No current facility-administered medications on file prior to visit.    Past Surgical History:  Procedure Laterality Date   ANTERIOR FUSION CERVICAL SPINE     BUBBLE STUDY  07/20/2019   Procedure:  BUBBLE STUDY;  Surgeon: Ricky Reichert, MD;  Location: [facility] ENDOSCOPY;  Service: Cardiovascular;;   LOOP RECORDER INSERTION N/A 07/20/2019   Procedure: LOOP RECORDER INSERTION;  Surgeon: Ricky Range, MD;  Location: [facility] INVASIVE CV LAB;  Service: Cardiovascular;  Laterality: N/A;   TEE WITHOUT CARDIOVERSION N/A 07/20/2019   Procedure: TRANSESOPHAGEAL ECHOCARDIOGRAM (TEE);  Surgeon: Ricky Reichert, MD;  Location: [facility] ENDOSCOPY;  Service: Cardiovascular;  Laterality: N/A;    Allergies  Allergen Reactions   Duloxetine     Nausea and drowsy.        Objective:  Physical Exam: VS: BP:132/85  HR: bpm  TEMP: ( )  RESP:   HT:5\' 11"  (180.3 cm)   WT:285 lb (129.3 kg)  BMI:39.77  Gen: NAD, speaks clearly, comfortable in exam room Respiratory: normal work of breathing on room air Skin: No rashes, abrasions, or ecchymosis MSK: Slow antalgic gait favoring the right side using a cane and a 20 degree forward bend at the waist. Inspection of the right knee shows a boxlike appearance.  There is crepitus palpated with flexion and extension. Extension full to 0 degrees.  Flexion to 110 degrees. Varus and valgus stress normal with good endpoints There is mild medial joint line tenderness. Patellar translation nontender.  No tenderness to the femoral condyles or patellar edges Apley's compression test  negative Anterior and posterior drawers negative.  Ultrasound:  Limited ultrasound of the right knee There is no effusion appreciated in the suprapatellar bursa.  The quadriceps tendon is intact. Medial joint line shows marked loss of space with some rough degenerative changes of the femoral and tibial edges. Meniscus is visualized medially with out hypoechoic changes.  Summary: Imaging consistent with medial compartment arthritis  Ultrasound and interpretation by Dr. Webb Walter and Dr. Pearletha Walter    Assessment & Plan:   Osteoarthritis of right knee - History and exam findings consistent with  osteoarthritis - Limited ultrasound consistent with medial compartment advanced osteoarthritis - He has been using Voltaren gel with some mild improvement. - We discussed the use of nightly ice application and starting daily meloxicam for anti-inflammatory. - We also discussed weight loss strategies including cessation of soda intake. -We will follow-up in 4 weeks.  If there is no improvement at that point we can consider steroid injection.   Ricky Mote MD [facility]  Addendum:  Patient seen in the office by fellow.  His history, exam, plan of care were precepted with me.  Ricky Blizzard MD [facility]

## 2023-09-05 ENCOUNTER — Encounter: Payer: Self-pay | Admitting: Family Medicine

## 2023-09-07 DIAGNOSIS — F1120 Opioid dependence, uncomplicated: Secondary | ICD-10-CM | POA: Diagnosis not present

## 2023-09-21 DIAGNOSIS — F1120 Opioid dependence, uncomplicated: Secondary | ICD-10-CM | POA: Diagnosis not present

## 2023-09-25 DIAGNOSIS — F1120 Opioid dependence, uncomplicated: Secondary | ICD-10-CM | POA: Diagnosis not present

## 2023-10-02 ENCOUNTER — Ambulatory Visit (INDEPENDENT_AMBULATORY_CARE_PROVIDER_SITE_OTHER): Payer: Medicare HMO | Admitting: Student

## 2023-10-02 ENCOUNTER — Other Ambulatory Visit: Payer: Self-pay

## 2023-10-02 VITALS — BP 113/65 | HR 78 | Ht 71.0 in | Wt 293.0 lb

## 2023-10-02 DIAGNOSIS — R609 Edema, unspecified: Secondary | ICD-10-CM | POA: Diagnosis not present

## 2023-10-02 DIAGNOSIS — Z1211 Encounter for screening for malignant neoplasm of colon: Secondary | ICD-10-CM | POA: Diagnosis not present

## 2023-10-02 NOTE — Progress Notes (Signed)
    SUBJECTIVE:   CHIEF COMPLAINT / HPI:   "Retaining Fluid:"  -Patient reports that he is "retaining fluid" all over his body, mainly arms and legs  -H/o PFO, chronic Hep C -Last echo 02/2023: LVEF 55-60% with normal function. G1DD.  -Ongoing for several months but seems to be worsening recently  -No chest pain and dyspnea  -Happened before  -Has chronic lower extremity edema with previous diagnosis of venous insufficiency  -Previously advised compression socks and elevation but reports that this was not helpful   -Not on diuretic  -Notes that he voids but does not feel he gets everything out when urinating, urinates multiple times throughout the day   PERTINENT  PMH / PSH:  HTN PFO Stroke  Chronic hep C Hypothyroidism  OA    OBJECTIVE:   BP 113/65   Pulse 78   Ht 5\' 11"  (1.803 m)   Wt 293 lb (132.9 kg)   SpO2 100%   BMI 40.87 kg/m   General: Alert and oriented in no apparent distress Heart: Regular rate and rhythm with no murmurs appreciated Lungs: CTA bilaterally, no wheezing Abdomen: Bowel sounds present, no abdominal pain Skin: Warm and dry Extremities: 2+ pitting edema in the legs bilaterally with venous stasis changes, swelling in hands and arms bilaterally, not pitting    ASSESSMENT/PLAN:   Assessment & Plan Swelling Keep using compression stockings and elevated, will check BNP, CMP, CBC, PSA, TSH (history of hypothyroidism) with reflex to evaluate for secondary causes of symptoms. Normal cardiac exam reassuring with previous echo with appropriate LVEF. Needs UTD RUQ U/S for hep C, instructed to see hepatologist. Follow up with PCP in 3 weeks  Screen for colon cancer Ordered colonoscopy      Alfredo Martinez, MD [facility]

## 2023-10-02 NOTE — Patient Instructions (Addendum)
It was great to see you today! Thank you for choosing [facility] for your primary care.  Today we addressed: For swelling: keep using compression stocking and elevate legs  Follow up with hepatologist  I will get a bunch of labs today  I have ordered the colonoscopy, they will call in 2 weeks   If you haven't already, sign up for My Chart to have easy access to your labs results, and communication with your primary care physician. I recommend that you always bring your medications to each appointment as this makes it easy to ensure you are on the correct medications and helps [place] not miss refills when you need them. Call the clinic at [phone] if your symptoms worsen or you have any concerns. Return in about 3 weeks (around 10/23/2023). Please arrive 15 minutes before your appointment to ensure smooth check in process.  We appreciate your efforts in making this happen.  Thank you for allowing me to participate in your care, Ricky Martinez, MD 10/02/2023, 4:11 PM PGY-3, [facility]

## 2023-10-03 DIAGNOSIS — F1120 Opioid dependence, uncomplicated: Secondary | ICD-10-CM | POA: Diagnosis not present

## 2023-10-04 LAB — CBC WITH DIFFERENTIAL/PLATELET
Basophils Absolute: 0.1 10*3/uL (ref 0.0–0.2)
Basos: 1 %
EOS (ABSOLUTE): 0.3 10*3/uL (ref 0.0–0.4)
Eos: 4 %
Hematocrit: 39.3 % (ref 37.5–51.0)
Hemoglobin: 13 g/dL (ref 13.0–17.7)
Immature Grans (Abs): 0 10*3/uL (ref 0.0–0.1)
Immature Granulocytes: 0 %
Lymphocytes Absolute: 1.8 10*3/uL (ref 0.7–3.1)
Lymphs: 25 %
MCH: 29.3 pg (ref 26.6–33.0)
MCHC: 33.1 g/dL (ref 31.5–35.7)
MCV: 89 fL (ref 79–97)
Monocytes Absolute: 0.6 10*3/uL (ref 0.1–0.9)
Monocytes: 8 %
Neutrophils Absolute: 4.3 10*3/uL (ref 1.4–7.0)
Neutrophils: 62 %
Platelets: 195 10*3/uL (ref 150–450)
RBC: 4.44 x10E6/uL (ref 4.14–5.80)
RDW: 12.9 % (ref 11.6–15.4)
WBC: 7.1 10*3/uL (ref 3.4–10.8)

## 2023-10-04 LAB — COMPREHENSIVE METABOLIC PANEL
ALT: 7 [IU]/L (ref 0–44)
AST: 15 [IU]/L (ref 0–40)
Albumin: 3.9 g/dL (ref 3.9–4.9)
Alkaline Phosphatase: 105 [IU]/L (ref 44–121)
BUN/Creatinine Ratio: 8 — ABNORMAL LOW (ref 10–24)
BUN: 9 mg/dL (ref 8–27)
Bilirubin Total: 0.3 mg/dL (ref 0.0–1.2)
CO2: 25 mmol/L (ref 20–29)
Calcium: 8.9 mg/dL (ref 8.6–10.2)
Chloride: 104 mmol/L (ref 96–106)
Creatinine, Ser: 1.11 mg/dL (ref 0.76–1.27)
Globulin, Total: 2.8 g/dL (ref 1.5–4.5)
Glucose: 91 mg/dL (ref 70–99)
Potassium: 4.3 mmol/L (ref 3.5–5.2)
Sodium: 142 mmol/L (ref 134–144)
Total Protein: 6.7 g/dL (ref 6.0–8.5)
eGFR: 73 mL/min/{1.73_m2} (ref >59)

## 2023-10-04 LAB — PSA: Prostate Specific Ag, Serum: 12 ng/mL — ABNORMAL HIGH (ref 0.0–4.0)

## 2023-10-04 LAB — T4F: T4,Free (Direct): 1.36 ng/dL (ref 0.82–1.77)

## 2023-10-04 LAB — BRAIN NATRIURETIC PEPTIDE: BNP: 10.5 pg/mL (ref 0.0–100.0)

## 2023-10-04 LAB — TSH RFX ON ABNORMAL TO FREE T4: TSH: 8.1 u[IU]/mL — ABNORMAL HIGH (ref 0.450–4.500)

## 2023-10-04 LAB — HEMOGLOBIN A1C
Est. average glucose Bld gHb Est-mCnc: 128 mg/dL
Hgb A1c MFr Bld: 6.1 % — ABNORMAL HIGH (ref 4.8–5.6)

## 2023-10-05 DIAGNOSIS — F1120 Opioid dependence, uncomplicated: Secondary | ICD-10-CM | POA: Diagnosis not present

## 2023-10-07 ENCOUNTER — Other Ambulatory Visit: Payer: Self-pay | Admitting: Student

## 2023-10-07 ENCOUNTER — Encounter: Payer: Self-pay | Admitting: Student

## 2023-10-07 DIAGNOSIS — E039 Hypothyroidism, unspecified: Secondary | ICD-10-CM

## 2023-10-07 DIAGNOSIS — E785 Hyperlipidemia, unspecified: Secondary | ICD-10-CM

## 2023-10-07 DIAGNOSIS — R972 Elevated prostate specific antigen [PSA]: Secondary | ICD-10-CM

## 2023-10-07 MED ORDER — ASPIRIN 81 MG PO TBEC: 81.0000 mg | DELAYED_RELEASE_TABLET | Freq: Every day | ORAL | Status: AC

## 2023-10-07 MED ORDER — ATORVASTATIN CALCIUM 40 MG PO TABS: 40.0000 mg | ORAL_TABLET | Freq: Every day | ORAL | 2 refills | Status: AC

## 2023-10-07 MED ORDER — LEVOTHYROXINE SODIUM 125 MCG PO TABS: 250.0000 ug | ORAL_TABLET | Freq: Every day | ORAL | 2 refills | Status: AC

## 2023-10-11 ENCOUNTER — Encounter (HOSPITAL_BASED_OUTPATIENT_CLINIC_OR_DEPARTMENT_OTHER): Payer: Self-pay

## 2023-10-11 ENCOUNTER — Emergency Department (HOSPITAL_BASED_OUTPATIENT_CLINIC_OR_DEPARTMENT_OTHER)
Admission: EM | Admit: 2023-10-11 | Discharge: 2023-10-11 | Disposition: A | Discharge status: Home / Self Care | Payer: Medicare HMO | Attending: Emergency Medicine | Admitting: Emergency Medicine

## 2023-10-11 ENCOUNTER — Other Ambulatory Visit: Payer: Self-pay

## 2023-10-11 DIAGNOSIS — M79605 Pain in left leg: Secondary | ICD-10-CM | POA: Insufficient documentation

## 2023-10-11 DIAGNOSIS — M25562 Pain in left knee: Secondary | ICD-10-CM | POA: Diagnosis not present

## 2023-10-11 DIAGNOSIS — G8911 Acute pain due to trauma: Secondary | ICD-10-CM | POA: Diagnosis not present

## 2023-10-11 DIAGNOSIS — Y92410 Unspecified street and highway as the place of occurrence of the external cause: Secondary | ICD-10-CM | POA: Diagnosis not present

## 2023-10-11 DIAGNOSIS — Z7982 Long term (current) use of aspirin: Secondary | ICD-10-CM | POA: Insufficient documentation

## 2023-10-11 DIAGNOSIS — I10 Essential (primary) hypertension: Secondary | ICD-10-CM | POA: Diagnosis not present

## 2023-10-11 DIAGNOSIS — M25561 Pain in right knee: Secondary | ICD-10-CM | POA: Insufficient documentation

## 2023-10-11 DIAGNOSIS — M545 Low back pain, unspecified: Secondary | ICD-10-CM | POA: Insufficient documentation
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
## 2023-10-11 NOTE — ED Notes (Signed)
Pt discharged in stable condition. Pt expressed understanding about discharge instructions and to follow up with Ortho at scheduled appt and to return to ER for any concerns or complications. Pt ambulated out of room with cane with even steady gait, and requested this RN to get w/c and meet him while he was walking so he could get to his car quicker. This RN did so, no apparent distress noted.
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
## 2023-10-15 ENCOUNTER — Ambulatory Visit: Payer: Medicare HMO | Admitting: Family Medicine

## 2023-10-16 ENCOUNTER — Other Ambulatory Visit: Payer: Self-pay | Admitting: Nurse Practitioner

## 2023-10-16 DIAGNOSIS — K7400 Hepatic fibrosis, unspecified: Secondary | ICD-10-CM

## 2023-10-16 DIAGNOSIS — K760 Fatty (change of) liver, not elsewhere classified: Secondary | ICD-10-CM

## 2023-10-19 DIAGNOSIS — F1120 Opioid dependence, uncomplicated: Secondary | ICD-10-CM | POA: Diagnosis not present

## 2023-10-22 ENCOUNTER — Encounter: Payer: Self-pay | Admitting: Family Medicine

## 2023-10-22 ENCOUNTER — Ambulatory Visit: Payer: Medicare HMO | Admitting: Family Medicine

## 2023-10-22 VITALS — BP 133/85 | Ht 70.5 in | Wt 280.0 lb

## 2023-10-22 DIAGNOSIS — M1711 Unilateral primary osteoarthritis, right knee: Secondary | ICD-10-CM

## 2023-10-22 DIAGNOSIS — S39012A Strain of muscle, fascia and tendon of lower back, initial encounter: Secondary | ICD-10-CM | POA: Diagnosis not present

## 2023-10-22 NOTE — Patient Instructions (Signed)
Lidocaine patches.
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
## 2023-10-22 NOTE — Assessment & Plan Note (Addendum)
-   Ricky Walter has had some improvement in his symptoms using Voltaren gel and ice. - After discussion of possible treatment options he would like to continue with this regiment currently as his pain is manageable. - He has some concerns with pain patient due to methadone use chronically.  He can use Tylenol as needed for increased pain. - We will follow-up as needed.  If he decides he wants steroid injections we will be happy to do so.
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
## 2023-10-24 ENCOUNTER — Other Ambulatory Visit: Payer: Self-pay | Admitting: Family Medicine

## 2023-10-24 ENCOUNTER — Ambulatory Visit: Payer: Medicare HMO

## 2023-10-24 DIAGNOSIS — G8929 Other chronic pain: Secondary | ICD-10-CM

## 2023-10-27 ENCOUNTER — Ambulatory Visit: Payer: Medicare HMO | Admitting: Student

## 2023-10-27 NOTE — Progress Notes (Deleted)
    SUBJECTIVE:   CHIEF COMPLAINT / HPI:   Follow up for concern of fluid retention: - PSA was 12, referral sent to urology for further assessment given his concern for inability to completely void and fluid retention  - Last echo: G1DD with LVEF 55-60% 02/2023   PERTINENT  PMH / PSH:  HTN - Controlled  PFO Stroke - Atorvastatin, ASA Chronic hep C - follows with hepatology, has RUQ U/S scheduled  Hypothyroidism - Previously increased Levothyroxine, needs recheck 2 months from previous  OA - Sees sports medicine  Chronic lower back pain - Elavil //[facility] patient   OBJECTIVE:   There were no vitals taken for this visit.  General: Alert and oriented in no apparent distress Heart: Regular rate and rhythm with no murmurs appreciated Lungs: CTA bilaterally, no wheezing Abdomen: Bowel sounds present, no abdominal pain Skin: Warm and dry Extremities: No lower extremity edema   ASSESSMENT/PLAN:   Assessment & Plan       Ricky Martinez, MD [facility]

## 2023-11-02 DIAGNOSIS — F1120 Opioid dependence, uncomplicated: Secondary | ICD-10-CM | POA: Diagnosis not present

## 2023-11-04 ENCOUNTER — Ambulatory Visit
Admission: RE | Admit: 2023-11-04 | Discharge: 2023-11-04 | Disposition: A | Discharge status: Home / Self Care | Payer: Medicare HMO | Source: Ambulatory Visit | Attending: Nurse Practitioner | Admitting: Nurse Practitioner

## 2023-11-04 DIAGNOSIS — K760 Fatty (change of) liver, not elsewhere classified: Secondary | ICD-10-CM | POA: Diagnosis not present

## 2023-11-04 DIAGNOSIS — K7400 Hepatic fibrosis, unspecified: Secondary | ICD-10-CM

## 2023-11-06 DIAGNOSIS — F1120 Opioid dependence, uncomplicated: Secondary | ICD-10-CM | POA: Diagnosis not present

## 2023-11-13 NOTE — Progress Notes (Deleted)
    SUBJECTIVE:   CHIEF COMPLAINT / HPI:   Previously seen for concern of fluid retention with some difficulty completely voiding, sent to urology at that time with significantly elevated PSA.   Previously seen by [facility] for right knee OA and Lumbar pain. Instructed to continued with Voltaren gel and lidocaine patches   PERTINENT  PMH / PSH:  HTN PFO Stroke  Chronic hep C - Follows with [facility]  Hypothyroidism  OA   OBJECTIVE:   BP 132/87   Pulse 89   Ht 5\' 10"  (1.778 m)   Wt 293 lb (132.9 kg)   SpO2 99%   BMI 42.04 kg/m   General: Alert and oriented in no apparent distress Heart: Regular rate and rhythm with no murmurs appreciated Lungs: CTA bilaterally, no wheezing Abdomen: Bowel sounds present, no abdominal pain Skin: Warm and dry Extremities: No lower extremity edema   ASSESSMENT/PLAN:   Assessment & Plan Chronic low back pain without sciatica, unspecified back pain laterality  Primary osteoarthritis of right knee  Acute pain of left knee      Alfredo Martinez, MD [facility]

## 2023-11-13 NOTE — Patient Instructions (Addendum)
It was great to see you today! Thank you for choosing [facility] for your primary care.  Today we addressed: We have increased gabapentin to 300 mg a day I will ref to PT [address] for knee x-rays   If you haven't already, sign up for My Chart to have easy access to your labs results, and communication with your primary care physician. I recommend that you always bring your medications to each appointment as this makes it easy to ensure you are on the correct medications and helps [place] not miss refills when you need them. Call the clinic at [phone] if your symptoms worsen or you have any concerns.  Please arrive 15 minutes before your appointment to ensure smooth check in process.  We appreciate your efforts in making this happen.  Thank you for allowing me to participate in your care, Ricky Martinez, MD 11/14/2023, 12:04 PM PGY-3, [facility]

## 2023-11-14 ENCOUNTER — Ambulatory Visit (INDEPENDENT_AMBULATORY_CARE_PROVIDER_SITE_OTHER): Payer: Medicare HMO | Admitting: Student

## 2023-11-14 ENCOUNTER — Encounter: Payer: Self-pay | Admitting: Student

## 2023-11-14 DIAGNOSIS — M545 Low back pain, unspecified: Secondary | ICD-10-CM | POA: Diagnosis not present

## 2023-11-14 DIAGNOSIS — M1711 Unilateral primary osteoarthritis, right knee: Secondary | ICD-10-CM | POA: Diagnosis not present

## 2023-11-14 DIAGNOSIS — G8929 Other chronic pain: Secondary | ICD-10-CM | POA: Diagnosis not present

## 2023-11-14 DIAGNOSIS — M25562 Pain in left knee: Secondary | ICD-10-CM | POA: Diagnosis not present

## 2023-11-14 MED ORDER — GABAPENTIN 300 MG PO CAPS: 300.0000 mg | ORAL_CAPSULE | Freq: Every day | ORAL | 0 refills | Status: AC
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
## 2023-11-16 DIAGNOSIS — F1120 Opioid dependence, uncomplicated: Secondary | ICD-10-CM | POA: Diagnosis not present

## 2023-11-30 DIAGNOSIS — F1120 Opioid dependence, uncomplicated: Secondary | ICD-10-CM | POA: Diagnosis not present

## 2023-12-04 DIAGNOSIS — F1120 Opioid dependence, uncomplicated: Secondary | ICD-10-CM | POA: Diagnosis not present

## 2023-12-10 DIAGNOSIS — M545 Low back pain, unspecified: Secondary | ICD-10-CM | POA: Diagnosis not present

## 2023-12-10 DIAGNOSIS — M542 Cervicalgia: Secondary | ICD-10-CM | POA: Diagnosis not present

## 2023-12-14 DIAGNOSIS — F1120 Opioid dependence, uncomplicated: Secondary | ICD-10-CM | POA: Diagnosis not present

## 2023-12-16 DIAGNOSIS — M545 Low back pain, unspecified: Secondary | ICD-10-CM | POA: Diagnosis not present

## 2023-12-16 DIAGNOSIS — M542 Cervicalgia: Secondary | ICD-10-CM | POA: Diagnosis not present

## 2024-01-04 DIAGNOSIS — F1120 Opioid dependence, uncomplicated: Secondary | ICD-10-CM | POA: Diagnosis not present

## 2024-01-07 DIAGNOSIS — M542 Cervicalgia: Secondary | ICD-10-CM | POA: Diagnosis not present

## 2024-01-07 DIAGNOSIS — M545 Low back pain, unspecified: Secondary | ICD-10-CM | POA: Diagnosis not present

## 2024-01-18 DIAGNOSIS — F1120 Opioid dependence, uncomplicated: Secondary | ICD-10-CM | POA: Diagnosis not present

## 2024-01-20 DIAGNOSIS — M542 Cervicalgia: Secondary | ICD-10-CM | POA: Diagnosis not present

## 2024-01-20 DIAGNOSIS — M545 Low back pain, unspecified: Secondary | ICD-10-CM | POA: Diagnosis not present

## 2024-01-21 ENCOUNTER — Ambulatory Visit (INDEPENDENT_AMBULATORY_CARE_PROVIDER_SITE_OTHER): Payer: Medicare HMO | Admitting: Family Medicine

## 2024-01-21 ENCOUNTER — Encounter: Payer: Self-pay | Admitting: Family Medicine

## 2024-01-21 VITALS — BP 120/76 | HR 90 | Wt 297.0 lb

## 2024-01-21 DIAGNOSIS — R972 Elevated prostate specific antigen [PSA]: Secondary | ICD-10-CM

## 2024-01-21 DIAGNOSIS — M545 Low back pain, unspecified: Secondary | ICD-10-CM

## 2024-01-21 DIAGNOSIS — N529 Male erectile dysfunction, unspecified: Secondary | ICD-10-CM

## 2024-01-21 DIAGNOSIS — G8929 Other chronic pain: Secondary | ICD-10-CM | POA: Diagnosis not present

## 2024-01-21 MED ORDER — SILDENAFIL CITRATE 50 MG PO TABS: 50.0000 mg | ORAL_TABLET | Freq: Every day | ORAL | 0 refills | Status: AC | PRN

## 2024-01-21 MED ORDER — MELOXICAM 15 MG PO TABS: ORAL_TABLET | ORAL | 0 refills | Status: AC

## 2024-01-21 MED ORDER — LIDOCAINE 5 % EX PTCH: 1.0000 | MEDICATED_PATCH | CUTANEOUS | 0 refills | Status: AC

## 2024-01-21 NOTE — Assessment & Plan Note (Addendum)
Lumbar spine degeneration noted on MRI in 2023. Given perisistent pain and chronicity with point tenderness will obtain radiographs of lumbar spine.  No red flags. Rx meloxicam and lidocaine patch, f/u PRN or based on imaging results  Provided number for sports med center if he desires f/u  Discussed that he should not be using narcotics while on methadone. Also reviewed risks of opioid use
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
## 2024-01-21 NOTE — Patient Instructions (Addendum)
[facility] [address]  [phone]  [facility] [phone]   Try meloxicam daily and lidocaine patch

## 2024-02-01 DIAGNOSIS — F1120 Opioid dependence, uncomplicated: Secondary | ICD-10-CM | POA: Diagnosis not present

## 2024-02-09 ENCOUNTER — Encounter: Payer: Self-pay | Admitting: Family Medicine

## 2024-02-09 NOTE — Progress Notes (Signed)
 PCP recommended dismissal from the practice due to multiple missed appointments. The dismissal letter was printed and handed over to Vea to process as a certified letter. I verified the names and addresses on the letters and envelopes for each patient and advised Vea to do the same before mailing the letters.

## 2024-02-09 NOTE — Progress Notes (Signed)
 PCP requested dismissal for multiple no-shows to appointments.

## 2024-02-15 DIAGNOSIS — F1120 Opioid dependence, uncomplicated: Secondary | ICD-10-CM | POA: Diagnosis not present

## 2024-02-19 DIAGNOSIS — M542 Cervicalgia: Secondary | ICD-10-CM | POA: Diagnosis not present

## 2024-02-19 DIAGNOSIS — M545 Low back pain, unspecified: Secondary | ICD-10-CM | POA: Diagnosis not present

## 2024-02-22 DIAGNOSIS — F1120 Opioid dependence, uncomplicated: Secondary | ICD-10-CM | POA: Diagnosis not present

## 2024-03-07 DIAGNOSIS — F1120 Opioid dependence, uncomplicated: Secondary | ICD-10-CM | POA: Diagnosis not present

## 2024-03-10 DIAGNOSIS — M545 Low back pain, unspecified: Secondary | ICD-10-CM | POA: Diagnosis not present

## 2024-03-10 DIAGNOSIS — M542 Cervicalgia: Secondary | ICD-10-CM | POA: Diagnosis not present

## 2024-03-21 DIAGNOSIS — F1120 Opioid dependence, uncomplicated: Secondary | ICD-10-CM | POA: Diagnosis not present

## 2024-04-04 DIAGNOSIS — F1120 Opioid dependence, uncomplicated: Secondary | ICD-10-CM | POA: Diagnosis not present

## 2024-04-18 DIAGNOSIS — F1120 Opioid dependence, uncomplicated: Secondary | ICD-10-CM | POA: Diagnosis not present

## 2024-04-19 ENCOUNTER — Other Ambulatory Visit: Payer: Self-pay | Admitting: Student

## 2024-04-23 DIAGNOSIS — E039 Hypothyroidism, unspecified: Secondary | ICD-10-CM | POA: Diagnosis not present

## 2024-04-23 DIAGNOSIS — M1990 Unspecified osteoarthritis, unspecified site: Secondary | ICD-10-CM | POA: Diagnosis not present

## 2024-04-23 DIAGNOSIS — G5600 Carpal tunnel syndrome, unspecified upper limb: Secondary | ICD-10-CM | POA: Diagnosis not present

## 2024-04-23 DIAGNOSIS — M545 Low back pain, unspecified: Secondary | ICD-10-CM | POA: Diagnosis not present

## 2024-04-23 DIAGNOSIS — F1120 Opioid dependence, uncomplicated: Secondary | ICD-10-CM | POA: Diagnosis not present

## 2024-05-02 DIAGNOSIS — F1120 Opioid dependence, uncomplicated: Secondary | ICD-10-CM | POA: Diagnosis not present

## 2024-05-06 DIAGNOSIS — E7800 Pure hypercholesterolemia, unspecified: Secondary | ICD-10-CM | POA: Diagnosis not present

## 2024-05-06 DIAGNOSIS — Z6841 Body Mass Index (BMI) 40.0 and over, adult: Secondary | ICD-10-CM | POA: Diagnosis not present

## 2024-05-06 DIAGNOSIS — F1120 Opioid dependence, uncomplicated: Secondary | ICD-10-CM | POA: Diagnosis not present

## 2024-05-06 DIAGNOSIS — E039 Hypothyroidism, unspecified: Secondary | ICD-10-CM | POA: Diagnosis not present

## 2024-05-06 DIAGNOSIS — M545 Low back pain, unspecified: Secondary | ICD-10-CM | POA: Diagnosis not present

## 2024-05-06 DIAGNOSIS — Z79899 Other long term (current) drug therapy: Secondary | ICD-10-CM | POA: Diagnosis not present

## 2024-05-06 DIAGNOSIS — E559 Vitamin D deficiency, unspecified: Secondary | ICD-10-CM | POA: Diagnosis not present

## 2024-05-16 DIAGNOSIS — F1120 Opioid dependence, uncomplicated: Secondary | ICD-10-CM | POA: Diagnosis not present

## 2024-05-30 DIAGNOSIS — F1120 Opioid dependence, uncomplicated: Secondary | ICD-10-CM | POA: Diagnosis not present

## 2024-06-03 DIAGNOSIS — M545 Low back pain, unspecified: Secondary | ICD-10-CM | POA: Diagnosis not present

## 2024-06-03 DIAGNOSIS — E039 Hypothyroidism, unspecified: Secondary | ICD-10-CM | POA: Diagnosis not present

## 2024-06-03 DIAGNOSIS — Z79899 Other long term (current) drug therapy: Secondary | ICD-10-CM | POA: Diagnosis not present

## 2024-06-13 DIAGNOSIS — F1120 Opioid dependence, uncomplicated: Secondary | ICD-10-CM | POA: Diagnosis not present

## 2024-06-27 DIAGNOSIS — F1120 Opioid dependence, uncomplicated: Secondary | ICD-10-CM | POA: Diagnosis not present

## 2024-07-04 DIAGNOSIS — F1120 Opioid dependence, uncomplicated: Secondary | ICD-10-CM | POA: Diagnosis not present

## 2024-07-06 DIAGNOSIS — Z79899 Other long term (current) drug therapy: Secondary | ICD-10-CM | POA: Diagnosis not present

## 2024-07-06 DIAGNOSIS — M545 Low back pain, unspecified: Secondary | ICD-10-CM | POA: Diagnosis not present

## 2024-07-11 DIAGNOSIS — F1120 Opioid dependence, uncomplicated: Secondary | ICD-10-CM | POA: Diagnosis not present

## 2024-07-13 DIAGNOSIS — K760 Fatty (change of) liver, not elsewhere classified: Secondary | ICD-10-CM | POA: Diagnosis not present

## 2024-07-13 DIAGNOSIS — K7400 Hepatic fibrosis, unspecified: Secondary | ICD-10-CM | POA: Diagnosis not present

## 2024-07-18 DIAGNOSIS — F1120 Opioid dependence, uncomplicated: Secondary | ICD-10-CM | POA: Diagnosis not present

## 2024-07-25 DIAGNOSIS — F1120 Opioid dependence, uncomplicated: Secondary | ICD-10-CM | POA: Diagnosis not present

## 2024-08-01 DIAGNOSIS — F1120 Opioid dependence, uncomplicated: Secondary | ICD-10-CM | POA: Diagnosis not present

## 2024-08-08 DIAGNOSIS — F1120 Opioid dependence, uncomplicated: Secondary | ICD-10-CM | POA: Diagnosis not present

## 2024-08-15 DIAGNOSIS — F1120 Opioid dependence, uncomplicated: Secondary | ICD-10-CM | POA: Diagnosis not present

## 2024-08-22 DIAGNOSIS — F1120 Opioid dependence, uncomplicated: Secondary | ICD-10-CM | POA: Diagnosis not present

## 2024-08-31 DIAGNOSIS — R972 Elevated prostate specific antigen [PSA]: Secondary | ICD-10-CM | POA: Diagnosis not present

## 2024-09-05 DIAGNOSIS — F1120 Opioid dependence, uncomplicated: Secondary | ICD-10-CM | POA: Diagnosis not present

## 2024-09-19 DIAGNOSIS — F1120 Opioid dependence, uncomplicated: Secondary | ICD-10-CM | POA: Diagnosis not present

## 2024-10-03 DIAGNOSIS — F1120 Opioid dependence, uncomplicated: Secondary | ICD-10-CM | POA: Diagnosis not present

## 2024-10-24 DIAGNOSIS — F1120 Opioid dependence, uncomplicated: Secondary | ICD-10-CM | POA: Diagnosis not present

## 2024-11-21 DIAGNOSIS — F1120 Opioid dependence, uncomplicated: Secondary | ICD-10-CM | POA: Diagnosis not present
